# Patient Record
Sex: Female | Born: 1965 | Race: Black or African American | Hispanic: No | Smoking: Never smoker
Health system: Southern US, Community
[De-identification: ages and names within clinical notes are randomized; demographics above are authoritative.]

## PROBLEM LIST (undated history)

## (undated) DIAGNOSIS — Z91013 Allergy to seafood: Secondary | ICD-10-CM

## (undated) HISTORY — DX: Morbid (severe) obesity due to excess calories: E66.01

---

## 1999-02-13 ENCOUNTER — Encounter: Payer: Self-pay | Admitting: Emergency Medicine

## 1999-02-13 ENCOUNTER — Emergency Department (HOSPITAL_COMMUNITY): Admission: EM | Admit: 1999-02-13 | Discharge: 1999-02-13 | Payer: Self-pay | Admitting: Emergency Medicine

## 1999-07-29 ENCOUNTER — Emergency Department (HOSPITAL_COMMUNITY): Admission: EM | Admit: 1999-07-29 | Discharge: 1999-07-29 | Payer: Self-pay | Admitting: Emergency Medicine

## 1999-11-10 ENCOUNTER — Emergency Department (HOSPITAL_COMMUNITY): Admission: EM | Admit: 1999-11-10 | Discharge: 1999-11-10 | Payer: Self-pay | Admitting: *Deleted

## 2002-12-30 ENCOUNTER — Emergency Department (HOSPITAL_COMMUNITY): Admission: EM | Admit: 2002-12-30 | Discharge: 2002-12-30 | Payer: Self-pay | Admitting: Emergency Medicine

## 2011-11-19 ENCOUNTER — Encounter (HOSPITAL_COMMUNITY): Payer: Self-pay | Admitting: Emergency Medicine

## 2011-11-19 ENCOUNTER — Emergency Department (HOSPITAL_COMMUNITY)
Admission: EM | Admit: 2011-11-19 | Discharge: 2011-11-20 | Disposition: A | Discharge status: Home / Self Care | Payer: BC Managed Care – PPO | Attending: Emergency Medicine | Admitting: Emergency Medicine

## 2011-11-19 DIAGNOSIS — R300 Dysuria: Secondary | ICD-10-CM | POA: Insufficient documentation

## 2011-11-19 DIAGNOSIS — J45909 Unspecified asthma, uncomplicated: Secondary | ICD-10-CM | POA: Insufficient documentation

## 2011-11-19 DIAGNOSIS — S339XXA Sprain of unspecified parts of lumbar spine and pelvis, initial encounter: Secondary | ICD-10-CM | POA: Insufficient documentation

## 2011-11-19 DIAGNOSIS — S39012A Strain of muscle, fascia and tendon of lower back, initial encounter: Secondary | ICD-10-CM

## 2011-11-19 DIAGNOSIS — X58XXXA Exposure to other specified factors, initial encounter: Secondary | ICD-10-CM | POA: Insufficient documentation

## 2011-11-19 DIAGNOSIS — E669 Obesity, unspecified: Secondary | ICD-10-CM | POA: Insufficient documentation

## 2011-11-19 DIAGNOSIS — M545 Low back pain, unspecified: Secondary | ICD-10-CM | POA: Insufficient documentation

## 2011-11-19 DIAGNOSIS — R109 Unspecified abdominal pain: Secondary | ICD-10-CM | POA: Insufficient documentation

## 2011-11-19 LAB — URINALYSIS, ROUTINE W REFLEX MICROSCOPIC
Bilirubin Urine: NEGATIVE
Glucose, UA: NEGATIVE mg/dL
Hgb urine dipstick: NEGATIVE
Ketones, ur: 15 mg/dL — AB
Leukocytes, UA: NEGATIVE
Nitrite: NEGATIVE
Protein, ur: NEGATIVE mg/dL
Specific Gravity, Urine: 1.027 (ref 1.005–1.030)
Urobilinogen, UA: 1 mg/dL (ref 0.0–1.0)
pH: 7 (ref 5.0–8.0)

## 2011-11-19 NOTE — ED Provider Notes (Signed)
Medical screening examination/treatment/procedure(s) were performed by non-physician practitioner and as supervising physician I was immediately available for consultation/collaboration.  Jasmine Awe, MD 11/19/11 [phone]

## 2011-11-19 NOTE — ED Notes (Signed)
Pt c/o left flank pain onset yesterday.  Nausea without vomiting

## 2011-11-19 NOTE — ED Provider Notes (Cosign Needed Addendum)
History     CSN: [id number]  Arrival date & time 11/19/11  2109   First MD Initiated Contact with Patient 11/19/11 2313      Chief Complaint  Patient presents with  . Flank Pain    (Consider location/radiation/quality/duration/timing/severity/associated sxs/prior treatment) HPI Comments: Last of morbidly, obese patient was getting ready for bed.  She noticed some low left back pain and some dysuria.  This morning.  Denies fever, nausea, vomiting, chills  Patient is a 46 y.o. female presenting with flank pain. The history is provided by the patient.  Flank Pain This is a new problem. The current episode started yesterday. Associated symptoms include urinary symptoms. Pertinent negatives include no abdominal pain, chills, congestion, coughing, fever, nausea, sore throat, vomiting or weakness.    Past Medical History  Diagnosis Date  . Asthma     Past Surgical History  Procedure Date  . Cesarean section     No family history on file.  History  Substance Use Topics  . Smoking status: Never Smoker   . Smokeless tobacco: Not on file  . Alcohol Use: No    OB History    Grav Para Term Preterm Abortions TAB SAB Ect Mult Living                  Review of Systems  Constitutional: Negative for fever and chills.  HENT: Negative for congestion and sore throat.   Respiratory: Negative for cough.   Gastrointestinal: Negative for nausea, vomiting and abdominal pain.  Genitourinary: Positive for dysuria and flank pain.  Musculoskeletal: Positive for back pain.  Neurological: Negative for dizziness and weakness.    Allergies  Nsaids; Shellfish allergy; and Tylox  Home Medications   Current Outpatient Rx  Name Route Sig Dispense Refill  . CYCLOBENZAPRINE HCL 5 MG PO TABS Oral Take 1 tablet (5 mg total) by mouth 3 (three) times daily as needed for muscle spasms. 30 tablet 0  . TRAMADOL HCL 50 MG PO TABS Oral Take 1 tablet (50 mg total) by mouth every 6 (six) hours as needed  for pain. 15 tablet 0    BP 145/82  Pulse 67  Temp(Src) 98.3 F (36.8 C) (Oral)  Resp 17  SpO2 97%  LMP 10/19/2011  Physical Exam  Constitutional: She is oriented to person, place, and time. She appears well-developed and well-nourished.  HENT:  Head: Normocephalic.  Eyes: Pupils are equal, round, and reactive to light.  Neck: Normal range of motion.  Cardiovascular: Normal rate.   Pulmonary/Chest: Effort normal.  Musculoskeletal:       Arms: Neurological: She is alert and oriented to person, place, and time.  Skin: Skin is warm and dry.    ED Course  Procedures (including critical care time)  Labs Reviewed  URINALYSIS, ROUTINE W REFLEX MICROSCOPIC - Abnormal; Notable for the following:    APPearance CLOUDY (*)    Ketones, ur 15 (*)    All other components within normal limits   No results found.   1. Lumbosacral strain       MDM  This is most likely muscular in nature, but will check a urine, as patient has complaints of dysuria        Arman Filter, NP 11/19/11 2358  Arman Filter, NP 11/20/11 4098

## 2011-11-19 NOTE — ED Notes (Signed)
Pt reports (L) flank and back pain x 3 days.  Denies urinary symptoms.  Reports increased pain with movement and walking.  No distress noted.

## 2011-11-20 MED ORDER — TRAMADOL HCL 50 MG PO TABS: 50.0000 mg | ORAL_TABLET | Freq: Four times a day (QID) | ORAL | Status: AC | PRN

## 2011-11-20 MED ORDER — CYCLOBENZAPRINE HCL 5 MG PO TABS: 5.0000 mg | ORAL_TABLET | Freq: Three times a day (TID) | ORAL | Status: AC | PRN

## 2012-12-09 ENCOUNTER — Encounter (HOSPITAL_COMMUNITY): Payer: Self-pay | Admitting: *Deleted

## 2012-12-09 ENCOUNTER — Emergency Department (INDEPENDENT_AMBULATORY_CARE_PROVIDER_SITE_OTHER)
Admission: EM | Admit: 2012-12-09 | Discharge: 2012-12-09 | Disposition: A | Discharge status: Home / Self Care | Payer: BC Managed Care – PPO | Source: Home / Self Care | Attending: Emergency Medicine | Admitting: Emergency Medicine

## 2012-12-09 DIAGNOSIS — T7800XA Anaphylactic reaction due to unspecified food, initial encounter: Secondary | ICD-10-CM

## 2012-12-09 HISTORY — DX: Allergy to seafood: Z91.013

## 2012-12-09 MED ORDER — METHYLPREDNISOLONE SODIUM SUCC 125 MG IJ SOLR
INTRAMUSCULAR | Status: AC
  Filled 2012-12-09: qty 2

## 2012-12-09 MED ORDER — IPRATROPIUM BROMIDE 0.02 % IN SOLN
0.5000 mg | Freq: Once | RESPIRATORY_TRACT | Status: AC
  Administered 2012-12-09: 0.5 mg via RESPIRATORY_TRACT

## 2012-12-09 MED ORDER — SODIUM CHLORIDE 0.9 % IV SOLN
INTRAVENOUS | Status: DC
  Administered 2012-12-09 (×3): via INTRAVENOUS

## 2012-12-09 MED ORDER — DIPHENHYDRAMINE HCL 50 MG/ML IJ SOLN
INTRAMUSCULAR | Status: AC
  Filled 2012-12-09: qty 1

## 2012-12-09 MED ORDER — DIPHENHYDRAMINE HCL 50 MG/ML IJ SOLN
50.0000 mg | Freq: Once | INTRAMUSCULAR | Status: AC
  Administered 2012-12-09: 50 mg via INTRAMUSCULAR

## 2012-12-09 MED ORDER — ALBUTEROL SULFATE (5 MG/ML) 0.5% IN NEBU
5.0000 mg | INHALATION_SOLUTION | Freq: Once | RESPIRATORY_TRACT | Status: AC
  Administered 2012-12-09: 5 mg via RESPIRATORY_TRACT

## 2012-12-09 MED ORDER — EPINEPHRINE HCL 1 MG/ML IJ SOLN
0.3000 mg | Freq: Once | INTRAMUSCULAR | Status: AC
  Administered 2012-12-09: 0.3 mg via INTRAMUSCULAR

## 2012-12-09 MED ORDER — ALBUTEROL SULFATE (5 MG/ML) 0.5% IN NEBU
INHALATION_SOLUTION | RESPIRATORY_TRACT | Status: AC
  Filled 2012-12-09: qty 1

## 2012-12-09 MED ORDER — ALBUTEROL SULFATE HFA 108 (90 BASE) MCG/ACT IN AERS: 1.0000 | INHALATION_SPRAY | Freq: Four times a day (QID) | RESPIRATORY_TRACT | Status: DC | PRN

## 2012-12-09 MED ORDER — EPINEPHRINE HCL 1 MG/ML IJ SOLN
INTRAMUSCULAR | Status: AC
  Filled 2012-12-09: qty 1

## 2012-12-09 MED ORDER — METHYLPREDNISOLONE SODIUM SUCC 125 MG IJ SOLR
125.0000 mg | Freq: Once | INTRAMUSCULAR | Status: AC
  Administered 2012-12-09: 125 mg via INTRAVENOUS

## 2012-12-09 NOTE — ED Notes (Signed)
Ate her daughters egg roll @ 1610.  Allergic to shrimp and did not know it had shrimp in it.  C/o itching in her throat and lungs.  No rash or itching.  No swelling of tongue.  States it feels like her throat is swelling.  Voice sounds hoarse.  No acute respiratory distress on arrival.

## 2012-12-09 NOTE — ED Provider Notes (Signed)
Chief Complaint  Patient presents with  . 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 About 30 minutes prior to her arrival here she inadvertently ate an egg roll that contains some shrimp. Within 15 minutes of ingestion she noted an itchy feeling in her throat and in her chest, tightness in her lungs, and difficulty breathing. She did not have any generalized skin rash or swelling of her lips or tongue. She did have some wheezing.  Review of Systems:  Other than noted above, the patient denies any of the following symptoms. Systemic:  No fever, chills, sweats, or fatigue. ENT:  No nasal congestion, rhinorrhea, or sore throat. Pulmonary:  No cough, wheezing, shortness of breath, sputum production, hemoptysis. Cardiac:  No palpitations, rapid heartbeat, dizziness, presyncope or syncope. Skin:  No rash or itching. Ext:  No leg pain or swelling. Psych:  No anxiety or depression.  PMFSH:  Past medical history, family history, social history, meds, and allergies were reviewed and updated as needed. The patient denies any history of asthma, lung disease, heart disease, anxiety, or tobacco use.  Physical Exam:   Vital signs:  BP 143/71  Pulse 110  Temp(Src) 99 F (37.2 C) (Oral)  Resp 23  SpO2 99%  LMP 11/19/2012 Gen:  Alert, oriented, in mild distress, skin warm and dry. She appeared somewhat nervous and anxious and felt better sitting up than lying down. Eye:  PERRL, lids and conjunctivas normal.  Sclera non-icteric. ENT:  Mucous membranes moist, pharynx clear. Neck:  Supple, no adenopathy or tenderness.  No JVD. Lungs:  She had some expiratory wheezes anteriorly with good air movement bilaterally no rales. Heart:  Regular rhythm.  No gallops, murmers, clicks or rubs. Abdomen:  Soft, nontender, no organomegaly or mass.  Bowel sounds  normal.  No pulsatile abdominal mass or bruit. Ext:  No edema.  No calf tenderness and Homann's sign negative.  Pulses full and equal. Skin:  Warm and dry.  No rash.  Course in Urgent Care Center:   Immediately upon arrival she was started on nasal O2 at 2 L per minute and IV normal saline was started at 150 mL per hour. She was given Benadryl 50 mg IM and epinephrine 0.3 mL IM followed by Solu-Medrol 125 mg IV. She was reassessed thereafter and felt somewhat better but still had some wheezes and she states her lungs still felt a little bit tight. She was given a DuoNeb breathing treatment after that other than feeling shaky and tremulous she states she felt fine. She had no difficulty breathing. Lungs are completely clear at that time and her skin was clear throughout.  Assessment:  The encounter diagnosis was Anaphylactic reaction due to food, initial encounter.   Plan:   1.  The following meds were prescribed:   New Prescriptions   ALBUTEROL (PROVENTIL HFA;VENTOLIN HFA) 108 (90 BASE) MCG/ACT INHALER    Inhale 1-2 puffs into the lungs every 6 (six) hours as needed for wheezing.   She is to take Benadryl 25 mg every 4 hours while awake.  2.  The patient was instructed in symptomatic care and handouts were given. 3.  The patient was told to return if becoming worse in any way, if no better in 3 or 4 days, and given some red flag symptoms that would indicate earlier return.  Follow up:  The patient was told to follow up if she should have any further difficulty, suggested she go directly to the emergency room.     Reuben Likes, MD 12/09/12 [phone]

## 2018-06-22 ENCOUNTER — Emergency Department (HOSPITAL_COMMUNITY)
Admission: EM | Admit: 2018-06-22 | Discharge: 2018-06-22 | Disposition: A | Discharge status: Home / Self Care | Payer: Self-pay | Attending: Emergency Medicine | Admitting: Emergency Medicine

## 2018-06-22 ENCOUNTER — Emergency Department (HOSPITAL_COMMUNITY): Payer: Self-pay

## 2018-06-22 ENCOUNTER — Encounter (HOSPITAL_COMMUNITY): Payer: Self-pay

## 2018-06-22 ENCOUNTER — Other Ambulatory Visit: Payer: Self-pay

## 2018-06-22 DIAGNOSIS — J45909 Unspecified asthma, uncomplicated: Secondary | ICD-10-CM | POA: Insufficient documentation

## 2018-06-22 DIAGNOSIS — Z79899 Other long term (current) drug therapy: Secondary | ICD-10-CM | POA: Insufficient documentation

## 2018-06-22 DIAGNOSIS — M79671 Pain in right foot: Secondary | ICD-10-CM | POA: Insufficient documentation

## 2018-06-22 LAB — CBG MONITORING, ED: Glucose-Capillary: 110 mg/dL — ABNORMAL HIGH (ref 70–99)

## 2018-06-22 NOTE — ED Notes (Signed)
Pt requesting to have blood sugar checked.

## 2018-06-22 NOTE — Discharge Instructions (Signed)
You may alternate taking Tylenol and Ibuprofen as needed for pain control. You may take 400-600 mg of ibuprofen every 6 hours and [phone] mg of Tylenol every 6 hours. Do not exceed 4000 mg of Tylenol daily as this can lead to liver damage. Also, make sure to take Ibuprofen with meals as it can cause an upset stomach. Do not take other NSAIDs while taking Ibuprofen such as (Aleve, Naprosyn, Aspirin, Celebrex, etc) and do not take more than the prescribed dose as this can lead to ulcers and bleeding in your GI tract. You may use warm and cold compresses to help with your symptoms.   Please follow up with your primary doctor within the next 7-10 days for re-evaluation and further treatment of your symptoms.  You may also follow-up with the orthopedic doctor that was provided for you in your discharge paperwork.  You may repeat imaging in 7 to 10 days to rule out an occult fracture.  Please return to the ER sooner if you have any new or worsening symptoms.

## 2018-06-22 NOTE — ED Triage Notes (Signed)
Pt states while at work yesterday she noticed her right foot was hurting and swelling. Swelling noted to RLE with strong pulses, +1 edema.

## 2018-06-22 NOTE — ED Notes (Signed)
Pt verbalizes understanding of d/c instructions. Pt taken to lobby in wheelchair at d/c with all belongings and with family.   

## 2018-06-22 NOTE — ED Notes (Addendum)
ED Provider at bedside. 

## 2018-06-22 NOTE — ED Provider Notes (Signed)
[facility] EMERGENCY DEPARTMENT Provider Note   CSN: [id number] Arrival date & time: 06/22/18  1650     History   Chief Complaint Chief Complaint  Patient presents with  . Leg Swelling    HPI Debbie Vaughn is a 52 y.o. female.  HPI   Patient is a 52 year old female who presents emergency department today for evaluation of right foot pain that began yesterday while she was at work.  States that she is greater and she stands on her feet for multiple hours per day.  States she does not have good support in her shoes.  The pain is constant and worse with ambulation.  Has not tried any medications for her symptoms.  Pain is severe.  Denies any falls or known trauma but thinks she may have twisted in her sleep.  No fevers or chills.  States that her bilateral feet are chronically swollen, she feels that the swelling is worse on her right foot.  Past Medical History:  Diagnosis Date  . Asthma   . Shrimp allergy     There are no active problems to display for this patient.   Past Surgical History:  Procedure Laterality Date  . CESAREAN SECTION       OB History   None      Home Medications    Prior to Admission medications   Medication Sig Start Date End Date Taking? Authorizing Provider  albuterol (PROVENTIL HFA;VENTOLIN HFA) 108 (90 BASE) MCG/ACT inhaler Inhale 1-2 puffs into the lungs every 6 (six) hours as needed for wheezing. 12/09/12   Reuben Likes, MD    Family History Family History  Problem Relation Age of Onset  . Asthma Mother   . Diabetes Father     Social History Social History   Tobacco Use  . Smoking status: Never Smoker  . Smokeless tobacco: Never Used  Substance Use Topics  . Alcohol use: No  . Drug use: No     Allergies   Nsaids; Shellfish allergy; and Oxycodone-acetaminophen   Review of Systems Review of Systems  Constitutional: Negative for chills and fever.  Musculoskeletal:       Right foot pain  Skin:  Negative for color change and wound.     Physical Exam Updated Vital Signs BP (!) 137/98 (BP Location: Right Arm)   Pulse 91   Temp 98.8 F (37.1 C) (Oral)   Resp 18   Ht 5\' 5"  (1.651 m)   Wt 122.5 kg   SpO2 100%   BMI 44.93 kg/m   Physical Exam  Constitutional: She appears well-developed and well-nourished. No distress.  HENT:  Head: Normocephalic and atraumatic.  Eyes: Conjunctivae are normal.  Neck: Neck supple.  Cardiovascular: Normal rate.  Pulmonary/Chest: Effort normal.  Musculoskeletal:  Patient with tenderness along the bones with no obvious deformity.  Bilateral feet appear to be swollen but not edematous.  No asymmetric lower extremity swelling.  No calf tenderness.  No erythema noted.  Neurological: She is alert.  Skin: Skin is warm and dry.  Psychiatric: She has a normal mood and affect.  Nursing note and vitals reviewed.    ED Treatments / Results  Labs (all labs ordered are listed, but only abnormal results are displayed) Labs Reviewed  CBG MONITORING, ED - Abnormal; Notable for the following components:      Result Value   Glucose-Capillary 110 (*)    All other components within normal limits    EKG None  Radiology Dg  Foot Complete Right  Result Date: 06/22/2018 CLINICAL DATA:  Right foot pain and swelling EXAM: RIGHT FOOT COMPLETE - 3+ VIEW COMPARISON:  None. FINDINGS: No acute fracture. No dislocation. Soft tissue swelling over the dorsum of the forefoot. IMPRESSION: No acute bony pathology.  Soft tissue swelling is noted distally. Electronically Signed   By: Jolaine ClickArthur  Hoss M.D.   On: 06/22/2018 19:22    Procedures Procedures (including critical care time)  Medications Ordered in ED Medications - No data to display   Initial Impression / Assessment and Plan / ED Course  I have reviewed the triage vital signs and the nursing notes.  Pertinent labs & imaging results that were available during my care of the patient were reviewed by me and  considered in my medical decision making (see chart for details).     Final Clinical Impressions(s) / ED Diagnoses   Final diagnoses:  Right foot pain   Patient presenting with right foot pain and subjective swelling since yesterday.  No injury reported.  Patient has tenderness over the navicular bone on the medial aspect of the right foot.  No obvious unilateral swelling on exam.  No edema noted.  No erythema or warmth noted to suggest skin or soft tissue infection.  Discussed with patient that she may have a small stress fracture she may need to have repeat imaging in 7 to 10 days for reevaluation.  Will provide postop shoe and Ortho referral for follow-up.  Advised conservative with Tylenol, Motrin and rice protocol at home.  Return precautions discussed.  Patient voices understanding of plan reasons to return.  All questions answered  ED Discharge Orders    None       Rayne DuCouture, Dwayn Moravek S, PA-C 06/22/18 2017    Terrilee FilesButler, Michael C, MD 06/23/18 1104

## 2018-06-22 NOTE — ED Notes (Signed)
See EDP assessment 

## 2018-06-27 ENCOUNTER — Encounter (HOSPITAL_COMMUNITY): Payer: Self-pay | Admitting: Emergency Medicine

## 2018-06-27 ENCOUNTER — Emergency Department (HOSPITAL_COMMUNITY): Payer: Self-pay

## 2018-06-27 ENCOUNTER — Emergency Department (HOSPITAL_COMMUNITY)
Admission: EM | Admit: 2018-06-27 | Discharge: 2018-06-28 | Disposition: A | Discharge status: Home / Self Care | Payer: Self-pay | Attending: Emergency Medicine | Admitting: Emergency Medicine

## 2018-06-27 ENCOUNTER — Other Ambulatory Visit: Payer: Self-pay

## 2018-06-27 DIAGNOSIS — J4541 Moderate persistent asthma with (acute) exacerbation: Secondary | ICD-10-CM | POA: Insufficient documentation

## 2018-06-27 LAB — BASIC METABOLIC PANEL
Anion gap: 9 (ref 5–15)
BUN: 11 mg/dL (ref 6–20)
CALCIUM: 8.8 mg/dL — AB (ref 8.9–10.3)
CO2: 26 mmol/L (ref 22–32)
CREATININE: 0.74 mg/dL (ref 0.44–1.00)
Chloride: 105 mmol/L (ref 98–111)
GFR calc Af Amer: >60 mL/min (ref >60)
GFR calc non Af Amer: >60 mL/min (ref >60)
GLUCOSE: 110 mg/dL — AB (ref 70–99)
Potassium: 3.4 mmol/L — ABNORMAL LOW (ref 3.5–5.1)
Sodium: 140 mmol/L (ref 135–145)

## 2018-06-27 LAB — CBC
HCT: 39.8 % (ref 36.0–46.0)
Hemoglobin: 12.1 g/dL (ref 12.0–15.0)
MCH: 28.3 pg (ref 26.0–34.0)
MCHC: 30.4 g/dL (ref 30.0–36.0)
MCV: 93 fL (ref 78.0–100.0)
PLATELETS: 269 10*3/uL (ref 150–400)
RBC: 4.28 MIL/uL (ref 3.87–5.11)
RDW: 14.2 % (ref 11.5–15.5)
WBC: 8.4 10*3/uL (ref 4.0–10.5)

## 2018-06-27 LAB — I-STAT TROPONIN, ED: TROPONIN I, POC: 0 ng/mL (ref 0.00–0.08)

## 2018-06-27 LAB — I-STAT BETA HCG BLOOD, ED (MC, WL, AP ONLY): I-stat hCG, quantitative: <5 m[IU]/mL (ref <5)

## 2018-06-27 MED ORDER — ALBUTEROL SULFATE (2.5 MG/3ML) 0.083% IN NEBU
5.0000 mg | INHALATION_SOLUTION | Freq: Once | RESPIRATORY_TRACT | Status: AC
  Administered 2018-06-27: 5 mg via RESPIRATORY_TRACT
  Filled 2018-06-27: qty 6

## 2018-06-27 NOTE — ED Triage Notes (Signed)
Pt c/o shortness of breath and intermittent chest pain. Reports she recently moved in with her dad who smokes and "sprays a lot of chemicals".

## 2018-06-28 MED ORDER — ALBUTEROL SULFATE HFA 108 (90 BASE) MCG/ACT IN AERS: 1.0000 | INHALATION_SPRAY | Freq: Four times a day (QID) | RESPIRATORY_TRACT | 0 refills | Status: DC | PRN

## 2018-06-28 MED ORDER — PREDNISONE 20 MG PO TABS
60.0000 mg | ORAL_TABLET | Freq: Once | ORAL | Status: AC
  Administered 2018-06-28: 60 mg via ORAL
  Filled 2018-06-28: qty 3

## 2018-06-28 MED ORDER — ALBUTEROL SULFATE (2.5 MG/3ML) 0.083% IN NEBU
5.0000 mg | INHALATION_SOLUTION | Freq: Once | RESPIRATORY_TRACT | Status: AC
  Administered 2018-06-28: 5 mg via RESPIRATORY_TRACT
  Filled 2018-06-28: qty 6

## 2018-06-28 MED ORDER — IPRATROPIUM BROMIDE 0.02 % IN SOLN
0.5000 mg | Freq: Once | RESPIRATORY_TRACT | Status: AC
  Administered 2018-06-28: 0.5 mg via RESPIRATORY_TRACT
  Filled 2018-06-28: qty 2.5

## 2018-06-28 MED ORDER — PREDNISONE 50 MG PO TABS: ORAL_TABLET | ORAL | 0 refills | Status: DC

## 2018-06-28 NOTE — ED Provider Notes (Signed)
[facility] EMERGENCY DEPARTMENT Provider Note   CSN: [id number] Arrival date & time: 06/27/18  2227     History   Chief Complaint Chief Complaint  Patient presents with  . Shortness of Breath    HPI Debbie Vaughn is a 52 y.o. female.  The history is provided by the patient.  Shortness of Breath  This is a new problem. The current episode started more than 1 week ago. The problem has been gradually worsening. Associated symptoms include wheezing. Pertinent negatives include no fever, no hemoptysis and no vomiting. The problem's precipitants include smoke. Associated medical issues include asthma.   With history of asthma presents with shortness of breath over the past 2 weeks.  She reports recently moved in with her father, since that time she been exposed to secondhand smoke as well as "lots of chemicals ".  She reports that there is frequent use of insecticides.  She is a non-smoker. She reports mild chest tightness due to wheezing.  Denies hemoptysis.  She reports her asthma is typically very well controlled Past Medical History:  Diagnosis Date  . Asthma   . Shrimp allergy     There are no active problems to display for this patient.   Past Surgical History:  Procedure Laterality Date  . CESAREAN SECTION       OB History   None      Home Medications    Prior to Admission medications   Medication Sig Start Date End Date Taking? Authorizing Provider  albuterol (PROVENTIL HFA;VENTOLIN HFA) 108 (90 BASE) MCG/ACT inhaler Inhale 1-2 puffs into the lungs every 6 (six) hours as needed for wheezing. 12/09/12   Reuben LikesKeller, David C, MD    Family History Family History  Problem Relation Age of Onset  . Asthma Mother   . Diabetes Father     Social History Social History   Tobacco Use  . Smoking status: Never Smoker  . Smokeless tobacco: Never Used  Substance Use Topics  . Alcohol use: No  . Drug use: No     Allergies   Nsaids; Shellfish  allergy; and Oxycodone-acetaminophen   Review of Systems Review of Systems  Constitutional: Negative for fever.  Respiratory: Positive for shortness of breath and wheezing. Negative for hemoptysis.   Gastrointestinal: Negative for vomiting.  All other systems reviewed and are negative.    Physical Exam Updated Vital Signs BP (!) 158/91   Pulse 79   Temp 98.6 F (37 C) (Oral)   Resp 17   SpO2 100%   Physical Exam CONSTITUTIONAL: Well developed/well nourished HEAD: Normocephalic/atraumatic EYES: EOMI/PERRL ENMT: Mucous membranes moist NECK: supple no meningeal signs, no JVD SPINE/BACK:entire spine nontender CV: S1/S2 noted, no murmurs/rubs/gallops noted LUNGS: decreased BS noted bilaterally, mild tachypnea ABDOMEN: soft, nontender, no rebound or guarding, bowel sounds noted throughout abdomen GU:no cva tenderness NEURO: Pt is awake/alert/appropriate, moves all extremitiesx4.  No facial droop.   EXTREMITIES: pulses normal/equal, full ROM, no significant lower extremity edema SKIN: warm, color normal PSYCH: no abnormalities of mood noted, alert and oriented to situation   ED Treatments / Results  Labs (all labs ordered are listed, but only abnormal results are displayed) Labs Reviewed  BASIC METABOLIC PANEL - Abnormal; Notable for the following components:      Result Value   Potassium 3.4 (*)    Glucose, Bld 110 (*)    Calcium 8.8 (*)    All other components within normal limits  CBC  I-STAT TROPONIN, ED  I-STAT BETA HCG BLOOD, ED ([facility], [facility], [facility] ONLY)    EKG EKG Interpretation  Date/Time:  Tuesday June 27 2018 22:34:46 EDT Ventricular Rate:  94 PR Interval:  152 QRS Duration: 82 QT Interval:  368 QTC Calculation: 460 R Axis:   52 Text Interpretation:  Normal sinus rhythm Septal infarct , age undetermined Abnormal ECG Confirmed by Zadie Rhine ([id number]) on 06/28/2018 3:00:22 AM   Radiology Dg Chest 2 View  Result Date: 06/27/2018 CLINICAL DATA:   Chest pain EXAM: CHEST - 2 VIEW COMPARISON:  None. FINDINGS: The heart size and mediastinal contours are within normal limits. Both lungs are clear. Mild degenerative changes of the spine. IMPRESSION: No active cardiopulmonary disease. Electronically Signed   By: Jasmine Pang M.D.   On: 06/27/2018 23:39    Procedures Procedures   Medications Ordered in ED Medications  albuterol (PROVENTIL) (2.5 MG/3ML) 0.083% nebulizer solution 5 mg (5 mg Nebulization Given 06/27/18 2244)  albuterol (PROVENTIL) (2.5 MG/3ML) 0.083% nebulizer solution 5 mg (5 mg Nebulization Given 06/28/18 0322)  ipratropium (ATROVENT) nebulizer solution 0.5 mg (0.5 mg Nebulization Given 06/28/18 0323)  predniSONE (DELTASONE) tablet 60 mg (60 mg Oral Given 06/28/18 0323)     Initial Impression / Assessment and Plan / ED Course  I have reviewed the triage vital signs and the nursing notes.  Pertinent labs & imaging results that were available during my care of the patient were reviewed by me and considered in my medical decision making (see chart for details).     Patient presents with asthma exacerbation likely due to secondhand smoke as well as her mental exposures.  She is improved here in the emergency department with albuterol/Atrovent/prednisone.  She ambulated in the ER without difficulty. She is requesting discharge home so she can go to work.  Will give prescription for albuterol prednisone.  We discussed strict return precautions  Final Clinical Impressions(s) / ED Diagnoses   Final diagnoses:  Moderate persistent asthma with exacerbation    ED Discharge Orders         Ordered    albuterol (PROVENTIL HFA;VENTOLIN HFA) 108 (90 Base) MCG/ACT inhaler  Every 6 hours PRN     06/28/18 0455    predniSONE (DELTASONE) 50 MG tablet     06/28/18 0455           Zadie Rhine, MD 06/28/18 [phone]

## 2018-06-28 NOTE — ED Notes (Signed)
Pt ambulated with ease. O2 saturation stayed between 94%-97%

## 2018-08-27 ENCOUNTER — Ambulatory Visit (HOSPITAL_COMMUNITY)
Admission: EM | Admit: 2018-08-27 | Discharge: 2018-08-27 | Disposition: A | Discharge status: Home / Self Care | Payer: BLUE CROSS/BLUE SHIELD | Attending: Family Medicine | Admitting: Family Medicine

## 2018-08-27 ENCOUNTER — Encounter (HOSPITAL_COMMUNITY): Payer: Self-pay | Admitting: Emergency Medicine

## 2018-08-27 ENCOUNTER — Telehealth (HOSPITAL_COMMUNITY): Payer: Self-pay | Admitting: Emergency Medicine

## 2018-08-27 ENCOUNTER — Other Ambulatory Visit: Payer: Self-pay

## 2018-08-27 DIAGNOSIS — J4521 Mild intermittent asthma with (acute) exacerbation: Secondary | ICD-10-CM

## 2018-08-27 DIAGNOSIS — R062 Wheezing: Secondary | ICD-10-CM

## 2018-08-27 MED ORDER — ALBUTEROL SULFATE HFA 108 (90 BASE) MCG/ACT IN AERS
INHALATION_SPRAY | RESPIRATORY_TRACT | Status: AC
  Filled 2018-08-27: qty 6.7

## 2018-08-27 MED ORDER — ALBUTEROL SULFATE HFA 108 (90 BASE) MCG/ACT IN AERS
2.0000 | INHALATION_SPRAY | Freq: Once | RESPIRATORY_TRACT | Status: AC
  Administered 2018-08-27: 2 via RESPIRATORY_TRACT

## 2018-08-27 MED ORDER — CETIRIZINE HCL 10 MG PO TABS: 10.0000 mg | ORAL_TABLET | Freq: Every day | ORAL | 0 refills | Status: DC

## 2018-08-27 MED ORDER — IPRATROPIUM-ALBUTEROL 0.5-2.5 (3) MG/3ML IN SOLN
3.0000 mL | Freq: Once | RESPIRATORY_TRACT | Status: AC
Start: 2018-08-27 — End: 2018-08-27
  Administered 2018-08-27: 3 mL via RESPIRATORY_TRACT

## 2018-08-27 MED ORDER — PREDNISONE 20 MG PO TABS: ORAL_TABLET | ORAL | 0 refills | Status: DC

## 2018-08-27 MED ORDER — IPRATROPIUM-ALBUTEROL 0.5-2.5 (3) MG/3ML IN SOLN
RESPIRATORY_TRACT | Status: AC
  Filled 2018-08-27: qty 3

## 2018-08-27 NOTE — Telephone Encounter (Signed)
Patient requested that her medications be resent to the 24 hour [facility].

## 2018-08-27 NOTE — Discharge Instructions (Signed)
Course of prednisone to help with asthma flare.  May start zyrtec daily as well.  Use of inhaler provided every 4-6 hours as needed for wheezing or shortness of breath .  Please establish with a primary care provider for management of your asthma symptoms.

## 2018-08-27 NOTE — ED Provider Notes (Signed)
[facility]    CSN: [id number] Arrival date & time: 08/27/18  1548     History   Chief Complaint Chief Complaint  Patient presents with  . Asthma    HPI Debbie Vaughn is a 52 y.o. female.   Debbie Vaughn presents with complaints of asthma flare. She has had intermittent issues with her asthma since moving in with her father in June of this year. States that family members smoke in the home, and there are some bugs in the home which her father even sprays with treatment sprays. She sleeps on an air mattress in the living room. She lost her inhaler last night and hasn't been able to use today. It does help. She has wheezing, coughing, shortness of breath  At times. Cough is only sometimes productive. No chest pain or mucus production. She does not have a PCP. No fevers. Doesn't take any other medications for symptoms. Was seen in the ER 06/2018 for similar complaint.     ROS per HPI.      Past Medical History:  Diagnosis Date  . Asthma   . Shrimp allergy     There are no active problems to display for this patient.   Past Surgical History:  Procedure Laterality Date  . CESAREAN SECTION      OB History   None      Home Medications    Prior to Admission medications   Medication Sig Start Date End Date Taking? Authorizing Provider  albuterol (PROVENTIL HFA;VENTOLIN HFA) 108 (90 Base) MCG/ACT inhaler Inhale 1-2 puffs into the lungs every 6 (six) hours as needed for wheezing or shortness of breath. 06/28/18  Yes Zadie Rhine, MD  cetirizine (ZYRTEC) 10 MG tablet Take 1 tablet (10 mg total) by mouth daily. 08/27/18   Georgetta Haber, NP  predniSONE (DELTASONE) 20 MG tablet 3-3-3-2-2-2-1-1-1 08/27/18   Georgetta Haber, NP    Family History Family History  Problem Relation Age of Onset  . Asthma Mother   . Diabetes Father     Social History Social History   Tobacco Use  . Smoking status: Never Smoker  . Smokeless tobacco: Never Used  Substance Use  Topics  . Alcohol use: No  . Drug use: No     Allergies   Nsaids; Shellfish allergy; and Oxycodone-acetaminophen   Review of Systems Review of Systems   Physical Exam Triage Vital Signs ED Triage Vitals  Enc Vitals Group     BP 08/27/18 1629 (!) 150/99     Pulse Rate 08/27/18 1629 87     Resp --      Temp 08/27/18 1629 97.9 F (36.6 C)     Temp src --      SpO2 08/27/18 1629 98 %     Weight --      Height --      Head Circumference --      Peak Flow --      Pain Score 08/27/18 1630 5     Pain Loc --      Pain Edu? --      Excl. in GC? --    No data found.  Updated Vital Signs BP (!) 150/99 (BP Location: Left Arm)   Pulse 87   Temp 97.9 F (36.6 C)   SpO2 98%   Visual Acuity Right Eye Distance:   Left Eye Distance:   Bilateral Distance:    Right Eye Near:   Left Eye Near:    Bilateral Near:  Physical Exam  Constitutional: She is oriented to person, place, and time. She appears well-developed and well-nourished. No distress.  HENT:  Head: Normocephalic and atraumatic.  Right Ear: Tympanic membrane, external ear and ear canal normal.  Left Ear: Tympanic membrane, external ear and ear canal normal.  Nose: Nose normal.  Mouth/Throat: Uvula is midline, oropharynx is clear and moist and mucous membranes are normal. No tonsillar exudate.  Eyes: Pupils are equal, round, and reactive to light. Conjunctivae and EOM are normal.  Cardiovascular: Normal rate, regular rhythm and normal heart sounds.  Pulmonary/Chest: Effort normal. She has wheezes.  Expiratory wheezing throughout; no increased work of breathing or cough noted   Neurological: She is alert and oriented to person, place, and time.  Skin: Skin is warm and dry.     UC Treatments / Results  Labs (all labs ordered are listed, but only abnormal results are displayed) Labs Reviewed - No data to display  EKG None  Radiology No results found.  Procedures Procedures (including critical care  time)  Medications Ordered in UC Medications  albuterol (PROVENTIL HFA;VENTOLIN HFA) 108 (90 Base) MCG/ACT inhaler 2 puff (has no administration in time range)  ipratropium-albuterol (DUONEB) 0.5-2.5 (3) MG/3ML nebulizer solution 3 mL (has no administration in time range)    Initial Impression / Assessment and Plan / UC Course  I have reviewed the triage vital signs and the nursing notes.  Pertinent labs & imaging results that were available during my care of the patient were reviewed by me and considered in my medical decision making (see chart for details).     Wheezing today but without increased work of breathing or distress. duoneb provided. Inhaler provided prior to departure. Course of prednisone provided.zyrtec for potential allergens in home as well. Encouraged establish with PCP for management. Return precautions provided. Patient verbalized understanding and agreeable to plan.  Ambulatory out of clinic without difficulty.   Final Clinical Impressions(s) / UC Diagnoses   Final diagnoses:  Mild intermittent asthma with exacerbation     Discharge Instructions     Course of prednisone to help with asthma flare.  May start zyrtec daily as well.  Use of inhaler provided every 4-6 hours as needed for wheezing or shortness of breath .  Please establish with a primary care provider for management of your asthma symptoms.    ED Prescriptions    Medication Sig Dispense Auth. Provider   predniSONE (DELTASONE) 20 MG tablet 3-3-3-2-2-2-1-1-1 18 tablet Linus MakoBurky, Loreal Schuessler B, NP   cetirizine (ZYRTEC) 10 MG tablet Take 1 tablet (10 mg total) by mouth daily. 30 tablet Georgetta HaberBurky, Nylene Inlow B, NP     Controlled Substance Prescriptions [place] Controlled Substance Registry consulted? Not Applicable   Georgetta HaberBurky, Richanda Darin B, NP 08/27/18 1708

## 2018-08-27 NOTE — ED Triage Notes (Addendum)
Pt reports a history of asthma and her inhaler ran out yesterday.  She moved in with her father in June and he is a smoker.  She states she has been having trouble breathing since she moved in with him.  She is requesting a prescription for a nebulizer and a humidifier.

## 2018-11-13 ENCOUNTER — Emergency Department (HOSPITAL_COMMUNITY)
Admission: EM | Admit: 2018-11-13 | Discharge: 2018-11-14 | Disposition: A | Discharge status: Home / Self Care | Payer: BLUE CROSS/BLUE SHIELD | Attending: Emergency Medicine | Admitting: Emergency Medicine

## 2018-11-13 ENCOUNTER — Other Ambulatory Visit: Payer: Self-pay

## 2018-11-13 ENCOUNTER — Encounter (HOSPITAL_COMMUNITY): Payer: Self-pay

## 2018-11-13 DIAGNOSIS — R0602 Shortness of breath: Secondary | ICD-10-CM | POA: Diagnosis present

## 2018-11-13 DIAGNOSIS — J4541 Moderate persistent asthma with (acute) exacerbation: Secondary | ICD-10-CM | POA: Diagnosis not present

## 2018-11-13 MED ORDER — SODIUM CHLORIDE 0.9% FLUSH: 3.0000 mL | Freq: Once | INTRAVENOUS | Status: DC

## 2018-11-13 MED ORDER — ALBUTEROL SULFATE (2.5 MG/3ML) 0.083% IN NEBU
5.0000 mg | INHALATION_SOLUTION | Freq: Once | RESPIRATORY_TRACT | Status: AC
  Administered 2018-11-13: 5 mg via RESPIRATORY_TRACT

## 2018-11-13 MED ORDER — ALBUTEROL SULFATE (2.5 MG/3ML) 0.083% IN NEBU
2.5000 mg | INHALATION_SOLUTION | Freq: Once | RESPIRATORY_TRACT | Status: DC
  Filled 2018-11-13: qty 3

## 2018-11-13 NOTE — ED Triage Notes (Signed)
Pt here for shortness of breath and chest pain for the last 4 hours.  Hx of asthma and usees an albuterol inhaler.  Pt used last of inhaler this afternoon.  Wheezes noted bilaterally.  Chest pain occurs when taking a deep breath. A&Ox4.

## 2018-11-14 ENCOUNTER — Emergency Department (HOSPITAL_COMMUNITY): Payer: BLUE CROSS/BLUE SHIELD

## 2018-11-14 LAB — BASIC METABOLIC PANEL
Anion gap: 11 (ref 5–15)
BUN: 11 mg/dL (ref 6–20)
CO2: 27 mmol/L (ref 22–32)
CREATININE: 0.83 mg/dL (ref 0.44–1.00)
Calcium: 9 mg/dL (ref 8.9–10.3)
Chloride: 104 mmol/L (ref 98–111)
GFR calc Af Amer: >60 mL/min (ref >60)
GFR calc non Af Amer: >60 mL/min (ref >60)
Glucose, Bld: 108 mg/dL — ABNORMAL HIGH (ref 70–99)
Potassium: 4.2 mmol/L (ref 3.5–5.1)
SODIUM: 142 mmol/L (ref 135–145)

## 2018-11-14 LAB — CBC
HCT: 40.6 % (ref 36.0–46.0)
Hemoglobin: 12.3 g/dL (ref 12.0–15.0)
MCH: 27.9 pg (ref 26.0–34.0)
MCHC: 30.3 g/dL (ref 30.0–36.0)
MCV: 92.1 fL (ref 80.0–100.0)
Platelets: 270 10*3/uL (ref 150–400)
RBC: 4.41 MIL/uL (ref 3.87–5.11)
RDW: 14.2 % (ref 11.5–15.5)
WBC: 10.5 10*3/uL (ref 4.0–10.5)
nRBC: 0 % (ref 0.0–0.2)

## 2018-11-14 LAB — I-STAT TROPONIN, ED: Troponin i, poc: 0 ng/mL (ref 0.00–0.08)

## 2018-11-14 LAB — I-STAT BETA HCG BLOOD, ED (MC, WL, AP ONLY): I-stat hCG, quantitative: <5 m[IU]/mL (ref <5)

## 2018-11-14 MED ORDER — PREDNISONE 20 MG PO TABS
60.0000 mg | ORAL_TABLET | Freq: Once | ORAL | Status: AC
  Administered 2018-11-14: 60 mg via ORAL
  Filled 2018-11-14: qty 3

## 2018-11-14 MED ORDER — PREDNISONE 20 MG PO TABS: ORAL_TABLET | ORAL | 0 refills | Status: DC

## 2018-11-14 MED ORDER — ALBUTEROL SULFATE (2.5 MG/3ML) 0.083% IN NEBU
5.0000 mg | INHALATION_SOLUTION | Freq: Once | RESPIRATORY_TRACT | Status: AC
  Administered 2018-11-14: 5 mg via RESPIRATORY_TRACT
  Filled 2018-11-14: qty 6

## 2018-11-14 MED ORDER — ALBUTEROL SULFATE (2.5 MG/3ML) 0.083% IN NEBU: 2.5000 mg | INHALATION_SOLUTION | RESPIRATORY_TRACT | 1 refills | Status: DC | PRN

## 2018-11-14 MED ORDER — IPRATROPIUM BROMIDE 0.02 % IN SOLN
0.5000 mg | Freq: Once | RESPIRATORY_TRACT | Status: AC
  Administered 2018-11-14: 0.5 mg via RESPIRATORY_TRACT
  Filled 2018-11-14: qty 2.5

## 2018-11-14 NOTE — ED Notes (Signed)
Discharge instructions (including medications) discussed with and copy provided to patient/caregiver.  Pt stable, ambulatory, and verbalizes understanding of discharge instructions.

## 2018-11-14 NOTE — ED Provider Notes (Signed)
[facility] EMERGENCY DEPARTMENT Provider Note   CSN: [id number] Arrival date & time: 11/13/18  2338     History   Chief Complaint Chief Complaint  Patient presents with  . Shortness of Breath    HPI Debbie Vaughn is a 53 y.o. female.  HPI  53 year old female presents with shortness of breath and feeling like she has asthma attack.  She is been feeling short of breath for several days but it seems to be worse recently with new cough.  No fevers.  Her chest feels tight and feels like there is something stuck in her chest like congestion.  She has tried over-the-counter breathing treatment meds do a nebulizer without relief.  Was given albuterol in triage and states she feels a little bit better but still feels like there is something in her chest.  No leg swelling.  Past Medical History:  Diagnosis Date  . Asthma   . Shrimp allergy     There are no active problems to display for this patient.   Past Surgical History:  Procedure Laterality Date  . CESAREAN SECTION       OB History   No obstetric history on file.      Home Medications    Prior to Admission medications   Medication Sig Start Date End Date Taking? Authorizing Provider  albuterol (PROVENTIL HFA;VENTOLIN HFA) 108 (90 Base) MCG/ACT inhaler Inhale 1-2 puffs into the lungs every 6 (six) hours as needed for wheezing or shortness of breath. 06/28/18   Zadie Rhine, MD  albuterol (PROVENTIL) (2.5 MG/3ML) 0.083% nebulizer solution Take 3 mLs (2.5 mg total) by nebulization every 4 (four) hours as needed for wheezing or shortness of breath. 11/14/18   Pricilla Loveless, MD  cetirizine (ZYRTEC) 10 MG tablet Take 1 tablet (10 mg total) by mouth daily. 08/27/18   Georgetta Haber, NP  predniSONE (DELTASONE) 20 MG tablet 2 tabs po daily x 4 days 11/15/18   Pricilla Loveless, MD    Family History Family History  Problem Relation Age of Onset  . Asthma Mother   . Diabetes Father     Social  History Social History   Tobacco Use  . Smoking status: Never Smoker  . Smokeless tobacco: Never Used  Substance Use Topics  . Alcohol use: No  . Drug use: No     Allergies   Nsaids; Shellfish allergy; and Oxycodone-acetaminophen   Review of Systems Review of Systems  Constitutional: Negative for fever.  HENT: Positive for congestion.   Respiratory: Positive for cough, chest tightness, shortness of breath and wheezing.   Cardiovascular: Negative for leg swelling.  All other systems reviewed and are negative.    Physical Exam Updated Vital Signs BP (!) 173/92   Pulse 76   Temp 98.5 F (36.9 C) (Oral)   Resp 18   SpO2 98%   Physical Exam Vitals signs and nursing note reviewed.  Constitutional:      General: She is not in acute distress.    Appearance: She is well-developed. She is obese. She is not diaphoretic.  HENT:     Head: Normocephalic and atraumatic.     Right Ear: External ear normal.     Left Ear: External ear normal.     Nose: Nose normal.  Eyes:     General:        Right eye: No discharge.        Left eye: No discharge.  Cardiovascular:     Rate and  Rhythm: Normal rate and regular rhythm.     Heart sounds: Normal heart sounds.  Pulmonary:     Effort: Pulmonary effort is normal. No tachypnea, accessory muscle usage or respiratory distress.     Breath sounds: Wheezing (diffuse, expiratory) present.     Comments: Speaks in complete sentences Abdominal:     General: There is no distension.  Skin:    General: Skin is warm and dry.  Neurological:     Mental Status: She is alert.  Psychiatric:        Mood and Affect: Mood is not anxious.      ED Treatments / Results  Labs (all labs ordered are listed, but only abnormal results are displayed) Labs Reviewed  BASIC METABOLIC PANEL - Abnormal; Notable for the following components:      Result Value   Glucose, Bld 108 (*)    All other components within normal limits  CBC  I-STAT TROPONIN, ED   I-STAT BETA HCG BLOOD, ED ([facility], [facility], [facility] ONLY)    EKG EKG Interpretation  Date/Time:  Tuesday November 14 2018 09:45:28 EST Ventricular Rate:  96 PR Interval:    QRS Duration: 91 QT Interval:  377 QTC Calculation: 477 R Axis:   46 Text Interpretation:  Sinus rhythm Probable anteroseptal infarct, old no acute ST/T changes Confirmed by Pricilla LovelessGoldston, Kani Jobson [phone]([id number]) on 11/14/2018 9:59:11 AM   Radiology Dg Chest 2 View  Result Date: 11/14/2018 CLINICAL DATA:  Pt here for shortness of breath and chest pain for the last 4 hours. Hx of asthma and usees an albuterol inhaler. Pt used last of inhaler this afternoon. Wheezes noted bilaterally. Chest pain occurs when taking a deep breath. EXAM: CHEST - 2 VIEW COMPARISON:  06/27/2018 FINDINGS: The heart size and mediastinal contours are within normal limits. Both lungs are clear. The visualized skeletal structures are unremarkable. IMPRESSION: No active cardiopulmonary disease. Electronically Signed   By: Elige KoHetal  Patel   On: 11/14/2018 00:05    Procedures Procedures (including critical care time)  Medications Ordered in ED Medications  sodium chloride flush (NS) 0.9 % injection 3 mL (3 mLs Intravenous Not Given 11/14/18 0829)  albuterol (PROVENTIL) (2.5 MG/3ML) 0.083% nebulizer solution 5 mg (5 mg Nebulization Given 11/13/18 2350)  albuterol (PROVENTIL) (2.5 MG/3ML) 0.083% nebulizer solution 5 mg (5 mg Nebulization Given 11/14/18 0837)  ipratropium (ATROVENT) nebulizer solution 0.5 mg (0.5 mg Nebulization Given 11/14/18 0837)  predniSONE (DELTASONE) tablet 60 mg (60 mg Oral Given 11/14/18 0837)     Initial Impression / Assessment and Plan / ED Course  I have reviewed the triage vital signs and the nursing notes.  Pertinent labs & imaging results that were available during my care of the patient were reviewed by me and considered in my medical decision making (see chart for details).     Patient feels significantly better after albuterol treatment here.  She  was also given steroids.  This appears to be a recurrent asthma exacerbation.  Unclear what she is using in her nebulizer given its over-the-counter but I will prescribe her albuterol and also prescribe her a steroid burst.  There is no obvious infectious cause such as pneumonia.  Highly doubt ACS, PE.  She appears stable for discharge home and we discussed return precautions.  Final Clinical Impressions(s) / ED Diagnoses   Final diagnoses:  Moderate persistent asthma with exacerbation    ED Discharge Orders         Ordered    predniSONE (DELTASONE) 20 MG  tablet     11/14/18 1007    albuterol (PROVENTIL) (2.5 MG/3ML) 0.083% nebulizer solution  Every 4 hours PRN     11/14/18 1007           Pricilla LovelessGoldston, Senay Sistrunk, MD 11/14/18 1010

## 2018-11-14 NOTE — Discharge Instructions (Signed)
If you develop fever, worsening trouble breathing, chest pain, vomiting, or any other new/concerning symptoms then return to the ER for evaluation.  It is very important to follow-up with a primary care physician.  Start the steroid/prednisone tomorrow as you were given the first dose today.  Use the albuterol nebulizer every 4 hours.

## 2019-08-01 ENCOUNTER — Emergency Department (HOSPITAL_COMMUNITY): Payer: BLUE CROSS/BLUE SHIELD

## 2019-08-01 ENCOUNTER — Encounter (HOSPITAL_COMMUNITY): Payer: Self-pay | Admitting: Emergency Medicine

## 2019-08-01 ENCOUNTER — Emergency Department (HOSPITAL_COMMUNITY)
Admission: EM | Admit: 2019-08-01 | Discharge: 2019-08-01 | Disposition: A | Discharge status: Home / Self Care | Payer: BLUE CROSS/BLUE SHIELD | Attending: Emergency Medicine | Admitting: Emergency Medicine

## 2019-08-01 ENCOUNTER — Other Ambulatory Visit: Payer: Self-pay

## 2019-08-01 DIAGNOSIS — E669 Obesity, unspecified: Secondary | ICD-10-CM | POA: Insufficient documentation

## 2019-08-01 DIAGNOSIS — J4531 Mild persistent asthma with (acute) exacerbation: Secondary | ICD-10-CM | POA: Diagnosis not present

## 2019-08-01 DIAGNOSIS — R0602 Shortness of breath: Secondary | ICD-10-CM | POA: Diagnosis present

## 2019-08-01 DIAGNOSIS — L03116 Cellulitis of left lower limb: Secondary | ICD-10-CM | POA: Diagnosis not present

## 2019-08-01 DIAGNOSIS — Z6841 Body Mass Index (BMI) 40.0 and over, adult: Secondary | ICD-10-CM | POA: Insufficient documentation

## 2019-08-01 LAB — BASIC METABOLIC PANEL
Anion gap: 8 (ref 5–15)
BUN: 8 mg/dL (ref 6–20)
CO2: 27 mmol/L (ref 22–32)
Calcium: 9 mg/dL (ref 8.9–10.3)
Chloride: 102 mmol/L (ref 98–111)
Creatinine, Ser: 0.7 mg/dL (ref 0.44–1.00)
GFR calc Af Amer: >60 mL/min (ref >60)
GFR calc non Af Amer: >60 mL/min (ref >60)
Glucose, Bld: 121 mg/dL — ABNORMAL HIGH (ref 70–99)
Potassium: 3.6 mmol/L (ref 3.5–5.1)
Sodium: 137 mmol/L (ref 135–145)

## 2019-08-01 LAB — CBC WITH DIFFERENTIAL/PLATELET
Abs Immature Granulocytes: 0.03 10*3/uL (ref 0.00–0.07)
Basophils Absolute: 0.1 10*3/uL (ref 0.0–0.1)
Basophils Relative: 1 %
Eosinophils Absolute: 0.8 10*3/uL — ABNORMAL HIGH (ref 0.0–0.5)
Eosinophils Relative: 8 %
HCT: 39.8 % (ref 36.0–46.0)
Hemoglobin: 12.3 g/dL (ref 12.0–15.0)
Immature Granulocytes: 0 %
Lymphocytes Relative: 22 %
Lymphs Abs: 2.2 10*3/uL (ref 0.7–4.0)
MCH: 28.3 pg (ref 26.0–34.0)
MCHC: 30.9 g/dL (ref 30.0–36.0)
MCV: 91.5 fL (ref 80.0–100.0)
Monocytes Absolute: 0.9 10*3/uL (ref 0.1–1.0)
Monocytes Relative: 9 %
Neutro Abs: 6 10*3/uL (ref 1.7–7.7)
Neutrophils Relative %: 60 %
Platelets: 292 10*3/uL (ref 150–400)
RBC: 4.35 MIL/uL (ref 3.87–5.11)
RDW: 14.7 % (ref 11.5–15.5)
WBC: 10.1 10*3/uL (ref 4.0–10.5)
nRBC: 0 % (ref 0.0–0.2)

## 2019-08-01 MED ORDER — ALBUTEROL SULFATE HFA 108 (90 BASE) MCG/ACT IN AERS
4.0000 | INHALATION_SPRAY | Freq: Once | RESPIRATORY_TRACT | Status: AC
  Administered 2019-08-01: 05:00:00 4 via RESPIRATORY_TRACT
  Filled 2019-08-01: qty 6.7

## 2019-08-01 MED ORDER — DEXAMETHASONE SODIUM PHOSPHATE 10 MG/ML IJ SOLN
10.0000 mg | Freq: Once | INTRAMUSCULAR | Status: AC
  Administered 2019-08-01: 10 mg via INTRAVENOUS
  Filled 2019-08-01: qty 1

## 2019-08-01 MED ORDER — DOXYCYCLINE HYCLATE 100 MG PO CAPS: 100.0000 mg | ORAL_CAPSULE | Freq: Two times a day (BID) | ORAL | 0 refills | Status: DC

## 2019-08-01 NOTE — ED Provider Notes (Signed)
[facility]-EMERGENCY DEPT Provider Note   CSN: [id number] Arrival date & time: 08/01/19  0228     History   Chief Complaint Chief Complaint  Patient presents with  . Asthma  . Hypertension    HPI Debbie Vaughn is a 53 y.o. female.     HPI  This is a 53 year old female who presents with asthma and left leg swelling and redness.  Patient reports that her asthma is "acting up."  She has run out of her inhalers.  She reports shortness of breath and wheezing.  No fevers or cough.  She also has noticed redness and increased swelling of the left lower extremity.  She states she has chronic issues with swelling and weeping of that lower extremity and that is not new.  She denies any fevers.  Past Medical History:  Diagnosis Date  . Asthma   . Shrimp allergy     There are no active problems to display for this patient.   Past Surgical History:  Procedure Laterality Date  . CESAREAN SECTION       OB History   No obstetric history on file.      Home Medications    Prior to Admission medications   Medication Sig Start Date End Date Taking? Authorizing Provider  albuterol (PROVENTIL HFA;VENTOLIN HFA) 108 (90 Base) MCG/ACT inhaler Inhale 1-2 puffs into the lungs every 6 (six) hours as needed for wheezing or shortness of breath. 06/28/18  Yes Zadie RhineWickline, Donald, MD  albuterol (PROVENTIL) (2.5 MG/3ML) 0.083% nebulizer solution Take 3 mLs (2.5 mg total) by nebulization every 4 (four) hours as needed for wheezing or shortness of breath. 11/14/18  Yes Pricilla LovelessGoldston, Scott, MD  cetirizine (ZYRTEC) 10 MG tablet Take 1 tablet (10 mg total) by mouth daily. Patient not taking: Reported on 08/01/2019 08/27/18   Linus MakoBurky, Natalie B, NP  doxycycline (VIBRAMYCIN) 100 MG capsule Take 1 capsule (100 mg total) by mouth 2 (two) times daily. 08/01/19   Horton, Mayer Maskerourtney F, MD  predniSONE (DELTASONE) 20 MG tablet 2 tabs po daily x 4 days Patient not taking: Reported on 08/01/2019 11/15/18    Pricilla LovelessGoldston, Scott, MD    Family History Family History  Problem Relation Age of Onset  . Asthma Mother   . Diabetes Father     Social History Social History   Tobacco Use  . Smoking status: Never Smoker  . Smokeless tobacco: Never Used  Substance Use Topics  . Alcohol use: No  . Drug use: No     Allergies   Nsaids, Shellfish allergy, and Oxycodone-acetaminophen   Review of Systems Review of Systems  Constitutional: Negative for fever.  Respiratory: Positive for shortness of breath and wheezing. Negative for cough.   Cardiovascular: Positive for leg swelling. Negative for chest pain.  Gastrointestinal: Negative for abdominal pain.  Musculoskeletal: Negative for back pain.  Skin: Positive for color change.  All other systems reviewed and are negative.    Physical Exam Updated Vital Signs BP (!) 148/71 (BP Location: Right Arm)   Pulse 86   Temp 99.5 F (37.5 C) (Oral)   Resp 20   Ht 1.651 m (5\' 5" )   Wt 122.5 kg   SpO2 96%   BMI 44.93 kg/m   Physical Exam Vitals signs and nursing note reviewed.  Constitutional:      Appearance: She is well-developed. She is obese.  HENT:     Head: Normocephalic and atraumatic.     Mouth/Throat:     Mouth: Mucous  membranes are moist.  Eyes:     Pupils: Pupils are equal, round, and reactive to light.  Neck:     Musculoskeletal: Neck supple.  Cardiovascular:     Rate and Rhythm: Normal rate and regular rhythm.     Heart sounds: Normal heart sounds.  Pulmonary:     Effort: Pulmonary effort is normal. No respiratory distress.     Breath sounds: Wheezing present.     Comments: Good air movement Abdominal:     General: Bowel sounds are normal.     Palpations: Abdomen is soft.     Tenderness: There is no abdominal tenderness.  Musculoskeletal:        General: Swelling present.     Comments: 3+ bilateral lower extremity edema, weeping on the left, there is erythema without significant warmth  Skin:    General: Skin is  warm and dry.     Comments: Erythema left lower extremity greater than right with chronic venous stasis changes  Neurological:     Mental Status: She is alert and oriented to person, place, and time.  Psychiatric:        Mood and Affect: Mood normal.      ED Treatments / Results  Labs (all labs ordered are listed, but only abnormal results are displayed) Labs Reviewed  CBC WITH DIFFERENTIAL/PLATELET - Abnormal; Notable for the following components:      Result Value   Eosinophils Absolute 0.8 (*)    All other components within normal limits  BASIC METABOLIC PANEL - Abnormal; Notable for the following components:   Glucose, Bld 121 (*)    All other components within normal limits    EKG None  Radiology Dg Chest 2 View  Result Date: 08/01/2019 CLINICAL DATA:  Cough. Shortness of breath. EXAM: CHEST - 2 VIEW COMPARISON:  11/14/2018 FINDINGS: Increased bronchial thickening. The cardiomediastinal contours are normal. Pulmonary vasculature is normal. No consolidation, pleural effusion, or pneumothorax. No acute osseous abnormalities are seen. IMPRESSION: Increased bronchial thickening, can be seen with bronchitis or asthma. Electronically Signed   By: Keith Rake M.D.   On: 08/01/2019 03:00    Procedures Procedures (including critical care time)  Medications Ordered in ED Medications  albuterol (VENTOLIN HFA) 108 (90 Base) MCG/ACT inhaler 4 puff (4 puffs Inhalation Given 08/01/19 0431)  dexamethasone (DECADRON) injection 10 mg (10 mg Intravenous Given 08/01/19 0438)     Initial Impression / Assessment and Plan / ED Course  I have reviewed the triage vital signs and the nursing notes.  Pertinent labs & imaging results that were available during my care of the patient were reviewed by me and considered in my medical decision making (see chart for details).        Patient presents with concerns for asthma exacerbation.  Also reports lower extremity swelling and  possible infection.  She is overall nontoxic and vital signs are reassuring.  She is morbidly obese.  She is wheezing on exam but no respiratory distress.  Patient was given 4 puffs of an HFA.  Additionally she was given Decadron.  X-ray without pneumonia.  I did obtain some lab work to ensure no leukocytosis.  She has pretty significant lower extremity swelling and what appears to be chronic venous stasis changes with likely some overlying cellulitis specifically on the left lower extremity.  Basic lab work is largely reassuring.  Will discharge home with HFA inhaler for asthma and doxycycline for cellulitis.  Recommend recheck and patient was given strict return precautions.  After history, exam, and medical workup I feel the patient has been appropriately medically screened and is safe for discharge home. Pertinent diagnoses were discussed with the patient. Patient was given return precautions.   Final Clinical Impressions(s) / ED Diagnoses   Final diagnoses:  Mild persistent asthma with exacerbation  Cellulitis of left lower extremity    ED Discharge Orders         Ordered    doxycycline (VIBRAMYCIN) 100 MG capsule  2 times daily     08/01/19 0729           Horton, Mayer Masker, MD 08/01/19 [phone]

## 2019-08-01 NOTE — ED Notes (Addendum)
Pt currently at 97% SPO2 RA.

## 2019-08-01 NOTE — ED Notes (Signed)
Patient walks with a steady gate and no difficulty.

## 2019-08-01 NOTE — ED Triage Notes (Addendum)
Patient here from work via EMS with complaints of asthma. Reports that she ran out of inhaler around 11am. No PCP, "usually get meds from hospital". Refused steroids.

## 2019-08-28 ENCOUNTER — Emergency Department (HOSPITAL_COMMUNITY)
Admission: EM | Admit: 2019-08-28 | Discharge: 2019-08-28 | Disposition: A | Discharge status: Home / Self Care | Payer: BLUE CROSS/BLUE SHIELD | Attending: Emergency Medicine | Admitting: Emergency Medicine

## 2019-08-28 ENCOUNTER — Emergency Department (HOSPITAL_COMMUNITY): Payer: BLUE CROSS/BLUE SHIELD

## 2019-08-28 ENCOUNTER — Encounter (HOSPITAL_COMMUNITY): Payer: Self-pay | Admitting: Emergency Medicine

## 2019-08-28 ENCOUNTER — Other Ambulatory Visit: Payer: Self-pay

## 2019-08-28 ENCOUNTER — Emergency Department (HOSPITAL_BASED_OUTPATIENT_CLINIC_OR_DEPARTMENT_OTHER): Payer: BLUE CROSS/BLUE SHIELD

## 2019-08-28 DIAGNOSIS — J4520 Mild intermittent asthma, uncomplicated: Secondary | ICD-10-CM | POA: Diagnosis not present

## 2019-08-28 DIAGNOSIS — Z79899 Other long term (current) drug therapy: Secondary | ICD-10-CM | POA: Insufficient documentation

## 2019-08-28 DIAGNOSIS — R609 Edema, unspecified: Secondary | ICD-10-CM | POA: Diagnosis not present

## 2019-08-28 DIAGNOSIS — I878 Other specified disorders of veins: Secondary | ICD-10-CM | POA: Insufficient documentation

## 2019-08-28 DIAGNOSIS — M7989 Other specified soft tissue disorders: Secondary | ICD-10-CM | POA: Diagnosis not present

## 2019-08-28 DIAGNOSIS — J45909 Unspecified asthma, uncomplicated: Secondary | ICD-10-CM | POA: Diagnosis present

## 2019-08-28 LAB — COMPREHENSIVE METABOLIC PANEL
ALT: 25 U/L (ref 0–44)
AST: 27 U/L (ref 15–41)
Albumin: 3.7 g/dL (ref 3.5–5.0)
Alkaline Phosphatase: 65 U/L (ref 38–126)
Anion gap: 9 (ref 5–15)
BUN: 7 mg/dL (ref 6–20)
CO2: 24 mmol/L (ref 22–32)
Calcium: 9 mg/dL (ref 8.9–10.3)
Chloride: 106 mmol/L (ref 98–111)
Creatinine, Ser: 0.59 mg/dL (ref 0.44–1.00)
GFR calc Af Amer: >60 mL/min (ref >60)
GFR calc non Af Amer: >60 mL/min (ref >60)
Glucose, Bld: 98 mg/dL (ref 70–99)
Potassium: 3.5 mmol/L (ref 3.5–5.1)
Sodium: 139 mmol/L (ref 135–145)
Total Bilirubin: 0.7 mg/dL (ref 0.3–1.2)
Total Protein: 7.5 g/dL (ref 6.5–8.1)

## 2019-08-28 LAB — CBC WITH DIFFERENTIAL/PLATELET
Abs Immature Granulocytes: 0.01 10*3/uL (ref 0.00–0.07)
Basophils Absolute: 0.1 10*3/uL (ref 0.0–0.1)
Basophils Relative: 1 %
Eosinophils Absolute: 0.7 10*3/uL — ABNORMAL HIGH (ref 0.0–0.5)
Eosinophils Relative: 10 %
HCT: 39.9 % (ref 36.0–46.0)
Hemoglobin: 12.3 g/dL (ref 12.0–15.0)
Immature Granulocytes: 0 %
Lymphocytes Relative: 29 %
Lymphs Abs: 1.9 10*3/uL (ref 0.7–4.0)
MCH: 28.4 pg (ref 26.0–34.0)
MCHC: 30.8 g/dL (ref 30.0–36.0)
MCV: 92.1 fL (ref 80.0–100.0)
Monocytes Absolute: 0.5 10*3/uL (ref 0.1–1.0)
Monocytes Relative: 7 %
Neutro Abs: 3.4 10*3/uL (ref 1.7–7.7)
Neutrophils Relative %: 53 %
Platelets: 293 10*3/uL (ref 150–400)
RBC: 4.33 MIL/uL (ref 3.87–5.11)
RDW: 14.7 % (ref 11.5–15.5)
WBC: 6.6 10*3/uL (ref 4.0–10.5)
nRBC: 0 % (ref 0.0–0.2)

## 2019-08-28 MED ORDER — ALBUTEROL SULFATE HFA 108 (90 BASE) MCG/ACT IN AERS
4.0000 | INHALATION_SPRAY | Freq: Once | RESPIRATORY_TRACT | Status: AC
  Administered 2019-08-28: 4 via RESPIRATORY_TRACT
  Filled 2019-08-28: qty 6.7

## 2019-08-28 MED ORDER — PREDNISONE 20 MG PO TABS: 40.0000 mg | ORAL_TABLET | Freq: Every day | ORAL | 0 refills | Status: AC

## 2019-08-28 MED ORDER — HYDROXYZINE HCL 25 MG PO TABS
25.0000 mg | ORAL_TABLET | Freq: Once | ORAL | Status: AC
  Administered 2019-08-28: 10:00:00 25 mg via ORAL
  Filled 2019-08-28: qty 1

## 2019-08-28 NOTE — ED Provider Notes (Signed)
[facility] EMERGENCY DEPARTMENT Provider Note   CSN: [id number] Arrival date & time: 08/28/19  4098     History   Chief Complaint Chief Complaint  Patient presents with   Asthma    HPI Debbie Vaughn is a 53 y.o. female.     53 y.o female with a PMH of Asthma, Chronic venous stasis presents to the ED via EMS with a chief complaint of asthma. Patient reports she has been using her inhaler more frequently, she reports she has run out of her butyryl inhaler.  She also reports she wanted to get her left leg evaluated.  She was seen here in the past for a left leg cellulitis, she has completed a full dose of doxycycline with some improvement to her leg although it is persistently swollen.  He denies any COVID-19 exposures, fevers, coughs, chest pain, shortness of breath. No admissions for asthma exacerbation on her chart.  The history is provided by the patient.    Past Medical History:  Diagnosis Date   Asthma    Shrimp allergy     There are no active problems to display for this patient.   Past Surgical History:  Procedure Laterality Date   CESAREAN SECTION       OB History   No obstetric history on file.      Home Medications    Prior to Admission medications   Medication Sig Start Date End Date Taking? Authorizing Provider  albuterol (PROVENTIL HFA;VENTOLIN HFA) 108 (90 Base) MCG/ACT inhaler Inhale 1-2 puffs into the lungs every 6 (six) hours as needed for wheezing or shortness of breath. 06/28/18   Zadie Rhine, MD  albuterol (PROVENTIL) (2.5 MG/3ML) 0.083% nebulizer solution Take 3 mLs (2.5 mg total) by nebulization every 4 (four) hours as needed for wheezing or shortness of breath. 11/14/18   Pricilla Loveless, MD  cetirizine (ZYRTEC) 10 MG tablet Take 1 tablet (10 mg total) by mouth daily. Patient not taking: Reported on 08/01/2019 08/27/18   Linus Mako B, NP  doxycycline (VIBRAMYCIN) 100 MG capsule Take 1 capsule (100 mg total) by  mouth 2 (two) times daily. 08/01/19   Horton, Mayer Masker, MD  predniSONE (DELTASONE) 20 MG tablet Take 2 tablets (40 mg total) by mouth daily for 5 days. 08/28/19 09/02/19  Claude Manges, PA-C    Family History Family History  Problem Relation Age of Onset   Asthma Mother    Diabetes Father     Social History Social History   Tobacco Use   Smoking status: Never Smoker   Smokeless tobacco: Never Used  Substance Use Topics   Alcohol use: No   Drug use: No     Allergies   Nsaids, Shellfish allergy, and Oxycodone-acetaminophen   Review of Systems Review of Systems  Constitutional: Negative for chills and fever.  HENT: Negative for ear pain and sore throat.   Eyes: Negative for pain and visual disturbance.  Respiratory: Negative for cough and shortness of breath.   Cardiovascular: Positive for leg swelling (left at baseline). Negative for chest pain and palpitations.  Gastrointestinal: Negative for abdominal pain and vomiting.  Genitourinary: Negative for dysuria and hematuria.  Musculoskeletal: Negative for arthralgias and back pain.  Skin: Negative for color change and rash.  Neurological: Negative for seizures and syncope.  All other systems reviewed and are negative.    Physical Exam Updated Vital Signs BP (!) 152/67    Pulse 76    Temp 97.7 F (36.5 C) (Oral)  Resp 20    Ht  (1.651 m)    Wt 122.5 kg    SpO2 100%    BMI 44.94 kg/m   Physical Exam Vitals signs and nursing note reviewed.  Constitutional:      General: She is not in acute distress.    Appearance: She is well-developed.  HENT:     Head: Normocephalic and atraumatic.     Mouth/Throat:     Mouth: Mucous membranes are moist.     Pharynx: No oropharyngeal exudate.  Eyes:     Pupils: Pupils are equal, round, and reactive to light.  Neck:     Musculoskeletal: Normal range of motion.  Cardiovascular:     Rate and Rhythm: Normal rate and regular rhythm.     Heart sounds: Normal heart  sounds.  Pulmonary:     Effort: Pulmonary effort is normal. No respiratory distress.     Breath sounds: Normal breath sounds.     Comments: No wheezing, slight decrease breath sounds.  No tachypnea. Abdominal:     General: Bowel sounds are normal. There is no distension.     Palpations: Abdomen is soft.     Tenderness: There is no abdominal tenderness.  Musculoskeletal:        General: No tenderness or deformity.     Right lower leg: No edema.     Left lower leg: No edema.     Comments: No bilateral pitting edema, no calf tenderness.  Skin:    General: Skin is warm and dry.     Comments: Left leg appears swollen, with changes consistent with venous stasis.  Neurovascularly intact.  Neurological:     Mental Status: She is alert and oriented to person, place, and time.      ED Treatments / Results  Labs (all labs ordered are listed, but only abnormal results are displayed) Labs Reviewed  CBC WITH DIFFERENTIAL/PLATELET - Abnormal; Notable for the following components:      Result Value   Eosinophils Absolute 0.7 (*)    All other components within normal limits  COMPREHENSIVE METABOLIC PANEL    EKG None  Radiology Dg Chest Portable 1 View  Result Date: 08/28/2019 CLINICAL DATA:  Shortness of breath EXAM: PORTABLE CHEST 1 VIEW COMPARISON:  August 01, 2019 FINDINGS: There is no evident edema or consolidation. Heart is upper normal in size with pulmonary vascularity normal. No adenopathy. No bone lesions. IMPRESSION: Heart upper normal in size.  No edema or consolidation. Electronically Signed   By: Bretta Bang III M.D.   On: 08/28/2019 10:38   Vas [place] Lower Extremity Venous (dvt) (only Mc & Wl 7a-7p)  Result Date: 08/28/2019  Lower Venous Study Indications: Edema, and Swelling.  Limitations: Poor ultrasound/tissue interface and body habitus. Comparison Study: no prior Performing Technologist: Blanch Media RVS  Examination Guidelines: A complete evaluation includes B-mode  imaging, spectral Doppler, color Doppler, and power Doppler as needed of all accessible portions of each vessel. Bilateral testing is considered an integral part of a complete examination. Limited examinations for reoccurring indications may be performed as noted.  +-----+---------------+---------+-----------+----------+--------------+  RIGHT Compressibility Phasicity Spontaneity Properties Thrombus Aging  +-----+---------------+---------+-----------+----------+--------------+  CFV   Full            Yes       Yes                                    +-----+---------------+---------+-----------+----------+--------------+   +---------+---------------+---------+-----------+----------+--------------+  LEFT      Compressibility Phasicity Spontaneity Properties Thrombus Aging  +---------+---------------+---------+-----------+----------+--------------+  CFV       Full            Yes       Yes                                    +---------+---------------+---------+-----------+----------+--------------+  SFJ       Full                                                             +---------+---------------+---------+-----------+----------+--------------+  FV Prox   Full                                                             +---------+---------------+---------+-----------+----------+--------------+  FV Mid    Full                                                             +---------+---------------+---------+-----------+----------+--------------+  FV Distal Full                                                             +---------+---------------+---------+-----------+----------+--------------+  PFV       Full                                                             +---------+---------------+---------+-----------+----------+--------------+  POP       Full            Yes       Yes                                    +---------+---------------+---------+-----------+----------+--------------+  PERO                                                        Not visualized  +---------+---------------+---------+-----------+----------+--------------+  Soleal                                                     Not visualized  +---------+---------------+---------+-----------+----------+--------------+  Summary: Right: No evidence of common femoral vein obstruction. Left: There is no evidence of deep vein thrombosis in the lower extremity. However, portions of this examination were limited- see technologist comments above. No cystic structure found in the popliteal fossa.  *See table(s) above for measurements and observations.    Preliminary     Procedures Procedures (including critical care time)  Medications Ordered in ED Medications  albuterol (VENTOLIN HFA) 108 (90 Base) MCG/ACT inhaler 4 puff (4 puffs Inhalation Given 08/28/19 1023)  hydrOXYzine (ATARAX/VISTARIL) tablet 25 mg (25 mg Oral Given 08/28/19 1023)     Initial Impression / Assessment and Plan / ED Course  I have reviewed the triage vital signs and the nursing notes.  Pertinent labs & imaging results that were available during my care of the patient were reviewed by me and considered in my medical decision making (see chart for details).   Patient with a past medical history of asthma along with chronic venous stasis of her bilateral lower extremities presents to the ED via EMS after running out of her inhaler along with requesting checkup to her left leg.  Patient was seen in the ED a month ago, was given a 7-day course of doxycycline which she completed with improvement in her left leg.  She does report her legs feel somewhat itchy.  She is not having any shortness of breath or chest pain, does report she has been using her inhaler more often due to weather changes.  She arrived in the ED standing at 100% on room air, no tachypnea, blood pressure seem elevated on arrival. She does not have any headache, chest pain, shortness of breath.  My evaluation  there is no inspiratory or expiratory wheezing.  No hypoxia, no tachypnea.  Respirations are 20 even and unlabored.  There is no bilateral pitting edema, no signs of heart failure on her x-ray, no signs of consolidation, no superimposed pneumonia.  CBC is without any leukocytosis, CMP is without any electrolyte derangement, creatinine level is within normal limits.  LFTs are unremarkable.  An ultrasound of her left leg was performed to further rule out any DVT.  Ultrasound is negative for DVT study.  Patient received a replacement inhaler while in the ED along with 4 puffs.  She does not have a prior history of diabetes, will go home on an inhaler along with a short course of steroids to with her breathing.  She does not have any prior admissions due to asthma exacerbation, no prior intubations.  I feel that patient is optimal for outpatient care.  We will also place a social work consult to help with arranging her outpatient follow-up and PCP establishment.  12:59 PM patient has been scheduled at the [facility] health clinic in order to obtain primary care follow-up, she will need to follow-up with them for her high blood pressure along with obtain refills on her inhaler.  She is otherwise in stable condition, no DVT noted on her ultrasound.  Stats are in the 100s with no tachypnea or tachycardia.  Patient stable for discharge.   Portions of this note were generated with Scientist, clinical (histocompatibility and immunogenetics). Dictation errors may occur despite best attempts at proofreading.  Final Clinical Impressions(s) / ED Diagnoses   Final diagnoses:  Mild intermittent asthma without complication  Venous stasis    ED Discharge Orders         Ordered    predniSONE (DELTASONE) 20 MG tablet  Daily     08/28/19 1237  Claude MangesSoto, Erasmo Vertz, PA-C 08/28/19 1301    Tegeler, Canary Brimhristopher J, MD 08/28/19 [phone]

## 2019-08-28 NOTE — Discharge Planning (Signed)
Jimmy Stipes J. Clydene Laming, RN, BSN, [place] [phone]  [facility] set up appointment with [facility] on 12/7 @0930 .

## 2019-08-28 NOTE — Progress Notes (Signed)
CSW received consult for patient to assist with a PCP and her asthma medications. CSW notified RN CM of need.  Madilyn Fireman, MSW, LCSW-A Transitions of Care  Clinical Social Worker  [facility] Emergency Departments  Medical ICU [phone]

## 2019-08-28 NOTE — Discharge Instructions (Signed)
You are provided with an inhaler today, please use this as directed.  I have also prescribed a short course of steroids, please take 2 tablets daily for the next 5 days.  Your chest x-ray today looked within normal limits.  You have an appointment to see a primary care physician scheduled the [place] and wellness clinic on December 7, please attend in order to establish primary care along with refill on medications.

## 2019-08-28 NOTE — Progress Notes (Signed)
Lower extremity venous has been completed.   Preliminary results in CV Proc.   Abram Sander 08/28/2019 11:27 AM

## 2019-08-28 NOTE — ED Triage Notes (Signed)
Pt BIB [facility] from home, c/o intermittent shortness of breath. LS clear, speaking in complete sentences. Reports that she is out of her home inhaler. Also reports that she wants her left leg checked for cellulitis.

## 2019-08-28 NOTE — ED Notes (Signed)
Patient Alert and oriented to baseline. Stable and ambulatory to baseline. Patient verbalized understanding of the discharge instructions.  Patient belongings were taken by the patient.   

## 2019-09-12 DIAGNOSIS — J45909 Unspecified asthma, uncomplicated: Secondary | ICD-10-CM | POA: Diagnosis present

## 2019-09-12 DIAGNOSIS — J4520 Mild intermittent asthma, uncomplicated: Secondary | ICD-10-CM | POA: Insufficient documentation

## 2019-09-12 DIAGNOSIS — Z79899 Other long term (current) drug therapy: Secondary | ICD-10-CM | POA: Insufficient documentation

## 2019-09-13 ENCOUNTER — Emergency Department (HOSPITAL_COMMUNITY)
Admission: EM | Admit: 2019-09-13 | Discharge: 2019-09-13 | Disposition: A | Discharge status: Home / Self Care | Payer: BLUE CROSS/BLUE SHIELD | Attending: Emergency Medicine | Admitting: Emergency Medicine

## 2019-09-13 ENCOUNTER — Encounter (HOSPITAL_COMMUNITY): Payer: Self-pay | Admitting: Emergency Medicine

## 2019-09-13 ENCOUNTER — Other Ambulatory Visit: Payer: Self-pay

## 2019-09-13 ENCOUNTER — Emergency Department (HOSPITAL_COMMUNITY): Payer: BLUE CROSS/BLUE SHIELD

## 2019-09-13 DIAGNOSIS — J4520 Mild intermittent asthma, uncomplicated: Secondary | ICD-10-CM

## 2019-09-13 LAB — CBC WITH DIFFERENTIAL/PLATELET
Abs Immature Granulocytes: 0.03 10*3/uL (ref 0.00–0.07)
Basophils Absolute: 0 10*3/uL (ref 0.0–0.1)
Basophils Relative: 0 %
Eosinophils Absolute: 0.7 10*3/uL — ABNORMAL HIGH (ref 0.0–0.5)
Eosinophils Relative: 7 %
HCT: 37.3 % (ref 36.0–46.0)
Hemoglobin: 11.6 g/dL — ABNORMAL LOW (ref 12.0–15.0)
Immature Granulocytes: 0 %
Lymphocytes Relative: 23 %
Lymphs Abs: 2.3 10*3/uL (ref 0.7–4.0)
MCH: 28.4 pg (ref 26.0–34.0)
MCHC: 31.1 g/dL (ref 30.0–36.0)
MCV: 91.4 fL (ref 80.0–100.0)
Monocytes Absolute: 0.8 10*3/uL (ref 0.1–1.0)
Monocytes Relative: 7 %
Neutro Abs: 6.3 10*3/uL (ref 1.7–7.7)
Neutrophils Relative %: 63 %
Platelets: 278 10*3/uL (ref 150–400)
RBC: 4.08 MIL/uL (ref 3.87–5.11)
RDW: 15 % (ref 11.5–15.5)
WBC: 10.1 10*3/uL (ref 4.0–10.5)
nRBC: 0 % (ref 0.0–0.2)

## 2019-09-13 LAB — BASIC METABOLIC PANEL
Anion gap: 5 (ref 5–15)
BUN: 9 mg/dL (ref 6–20)
CO2: 27 mmol/L (ref 22–32)
Calcium: 8.6 mg/dL — ABNORMAL LOW (ref 8.9–10.3)
Chloride: 109 mmol/L (ref 98–111)
Creatinine, Ser: 0.79 mg/dL (ref 0.44–1.00)
GFR calc Af Amer: >60 mL/min (ref >60)
GFR calc non Af Amer: >60 mL/min (ref >60)
Glucose, Bld: 107 mg/dL — ABNORMAL HIGH (ref 70–99)
Potassium: 4 mmol/L (ref 3.5–5.1)
Sodium: 141 mmol/L (ref 135–145)

## 2019-09-13 MED ORDER — ALBUTEROL SULFATE HFA 108 (90 BASE) MCG/ACT IN AERS
2.0000 | INHALATION_SPRAY | Freq: Once | RESPIRATORY_TRACT | Status: AC
  Administered 2019-09-13: 2 via RESPIRATORY_TRACT
  Filled 2019-09-13: qty 6.7

## 2019-09-13 MED ORDER — ALBUTEROL SULFATE HFA 108 (90 BASE) MCG/ACT IN AERS: 2.0000 | INHALATION_SPRAY | RESPIRATORY_TRACT | 1 refills | Status: DC | PRN

## 2019-09-13 MED ORDER — ALBUTEROL SULFATE (2.5 MG/3ML) 0.083% IN NEBU
5.0000 mg | INHALATION_SOLUTION | Freq: Once | RESPIRATORY_TRACT | Status: AC
  Administered 2019-09-13: 5 mg via RESPIRATORY_TRACT
  Filled 2019-09-13: qty 6

## 2019-09-13 MED ORDER — IPRATROPIUM BROMIDE 0.02 % IN SOLN
0.5000 mg | Freq: Once | RESPIRATORY_TRACT | Status: AC
  Administered 2019-09-13: 0.5 mg via RESPIRATORY_TRACT
  Filled 2019-09-13: qty 2.5

## 2019-09-13 MED ORDER — PREDNISONE 20 MG PO TABS
60.0000 mg | ORAL_TABLET | Freq: Once | ORAL | Status: AC
  Administered 2019-09-13: 60 mg via ORAL
  Filled 2019-09-13: qty 3

## 2019-09-13 MED ORDER — DIPHENHYDRAMINE HCL 25 MG PO CAPS
50.0000 mg | ORAL_CAPSULE | Freq: Once | ORAL | Status: AC
  Administered 2019-09-13: 50 mg via ORAL
  Filled 2019-09-13: qty 2

## 2019-09-13 MED ORDER — PREDNISONE 20 MG PO TABS: 60.0000 mg | ORAL_TABLET | Freq: Every day | ORAL | 0 refills | Status: DC

## 2019-09-13 MED ORDER — ALBUTEROL SULFATE (2.5 MG/3ML) 0.083% IN NEBU: 2.5000 mg | INHALATION_SOLUTION | Freq: Four times a day (QID) | RESPIRATORY_TRACT | 12 refills | Status: DC | PRN

## 2019-09-13 NOTE — ED Provider Notes (Signed)
TIME SEEN: 4:51 AM  CHIEF COMPLAINT: Asthma exacerbation  HPI: Patient is a 53 year old female with history of asthma who presents to the emergency department with an asthma exacerbation for the past day.  Reports wheezing.  Has had nonproductive cough.  No fevers.  No chest discomfort.  Also complains of itching of her bilateral lower extremities.  Recently treated for cellulitis which has improved and states the skin was peeling but is still itching.  No known Covid exposures.  ROS: See HPI Constitutional: no fever  Eyes: no drainage  ENT: no runny nose   Cardiovascular:  no chest pain  Resp: SOB  GI: no vomiting GU: no dysuria Integumentary: no rash  Allergy: no hives  Musculoskeletal: no leg swelling  Neurological: no slurred speech ROS otherwise negative  PAST MEDICAL HISTORY/PAST SURGICAL HISTORY:  Past Medical History:  Diagnosis Date  . Asthma   . Shrimp allergy     MEDICATIONS:  Prior to Admission medications   Medication Sig Start Date End Date Taking? Authorizing Provider  albuterol (PROVENTIL HFA;VENTOLIN HFA) 108 (90 Base) MCG/ACT inhaler Inhale 1-2 puffs into the lungs every 6 (six) hours as needed for wheezing or shortness of breath. 06/28/18   Ripley Fraise, MD  albuterol (PROVENTIL) (2.5 MG/3ML) 0.083% nebulizer solution Take 3 mLs (2.5 mg total) by nebulization every 4 (four) hours as needed for wheezing or shortness of breath. 11/14/18   Sherwood Gambler, MD  cetirizine (ZYRTEC) 10 MG tablet Take 1 tablet (10 mg total) by mouth daily. Patient not taking: Reported on 08/01/2019 08/27/18   Augusto Gamble B, NP  doxycycline (VIBRAMYCIN) 100 MG capsule Take 1 capsule (100 mg total) by mouth 2 (two) times daily. 08/01/19   Horton, Barbette Hair, MD    ALLERGIES:  Allergies  Allergen Reactions  . Nsaids Shortness Of Breath  . Shellfish Allergy Shortness Of Breath and Itching  . Oxycodone-Acetaminophen Itching    SOCIAL HISTORY:  Social History   Tobacco Use   . Smoking status: Never Smoker  . Smokeless tobacco: Never Used  Substance Use Topics  . Alcohol use: No    FAMILY HISTORY: Family History  Problem Relation Age of Onset  . Asthma Mother   . Diabetes Father     EXAM: BP (!) 170/99   Pulse 91   Temp 98.3 F (36.8 C)   Resp 18   SpO2 96%  CONSTITUTIONAL: Alert and oriented and responds appropriately to questions. Well-appearing; well-nourished HEAD: Normocephalic EYES: Conjunctivae clear, pupils appear equal, EOM appear intact ENT: normal nose; moist mucous membranes NECK: Supple, normal ROM CARD: RRR; S1 and S2 appreciated; no murmurs, no clicks, no rubs, no gallops RESP: Normal chest excursion without splinting or tachypnea; breath sounds equal bilaterally, scattered expiratory wheezes, diminished aeration at bases bilaterally, no rhonchi, no rales, no hypoxia or respiratory distress, speaking full sentences ABD/GI: Normal bowel sounds; non-distended; soft, non-tender, no rebound, no guarding, no peritoneal signs, no hepatosplenomegaly BACK:  The back appears normal EXT: Normal ROM in all joints; no deformity noted, no edema; no cyanosis, venous stasis noted to bilateral lower extremities worse on the left, no calf tenderness, no unilateral swelling, eczematous lesions noted to bilateral distal shins, ankles and dorsal feet without signs of superimposed infection SKIN: Normal color for age and race; warm; no rash on exposed skin NEURO: Moves all extremities equally PSYCH: The patient's mood and manner are appropriate.   MEDICAL DECISION MAKING: Patient here with asthma exacerbation.  Will give albuterol, Atrovent, prednisone.  Also  complaining of itching from eczematous-like lesions that have developed after treatment of cellulitis.  No sign of Trudie Buckler syndrome.  No recurrent infection at this time.  Will give dose of Benadryl for symptomatic relief.  ED PROGRESS: Patient's labs are unremarkable.  Chest x-ray obtained in  triage shows airspace opacity at the left lung base that represents atelectasis versus early infectious process.  She has no white count, no fever, no Covid exposures and no productive cough.  I do not think that she has pneumonia.  I do not feel she needs antibiotics.  She reports feeling better her lungs are now clear after nebulizer treatment.  Will discharge with new albuterol inhaler and prednisone burst.  Discussed return precautions.  At this time, I do not feel there is any life-threatening condition present. I have reviewed, interpreted and discussed all results (EKG, imaging, lab, urine as appropriate) and exam findings with patient/family. I have reviewed nursing notes and appropriate previous records.  I feel the patient is safe to be discharged home without further emergent workup and can continue workup as an outpatient as needed. Discussed usual and customary return precautions. Patient/family verbalize understanding and are comfortable with this plan.  Outpatient follow-up has been provided as needed. All questions have been answered.      Debbie Vaughn was evaluated in Emergency Department on 09/13/2019 for the symptoms described in the history of present illness. She was evaluated in the context of the global COVID-19 pandemic, which necessitated consideration that the patient might be at risk for infection with the SARS-CoV-2 virus that causes COVID-19. Institutional protocols and algorithms that pertain to the evaluation of patients at risk for COVID-19 are in a state of rapid change based on information released by regulatory bodies including the CDC and federal and state organizations. These policies and algorithms were followed during the patient's care in the ED.  Patient was seen wearing N95, face shield, gloves.    Ward, Layla Maw, DO 09/13/19 [phone]

## 2019-09-13 NOTE — ED Triage Notes (Signed)
Patient arrived with EMS from home reports asthma attack onset yesterday with occasional dry cough unrelieved by inhaler , denies fever or chills .

## 2019-09-17 ENCOUNTER — Encounter: Payer: Self-pay | Admitting: Critical Care Medicine

## 2019-09-17 ENCOUNTER — Ambulatory Visit: Payer: BLUE CROSS/BLUE SHIELD | Attending: Critical Care Medicine | Admitting: Critical Care Medicine

## 2019-09-17 DIAGNOSIS — J309 Allergic rhinitis, unspecified: Secondary | ICD-10-CM | POA: Insufficient documentation

## 2019-09-17 DIAGNOSIS — J3089 Other allergic rhinitis: Secondary | ICD-10-CM | POA: Diagnosis not present

## 2019-09-17 DIAGNOSIS — J4551 Severe persistent asthma with (acute) exacerbation: Secondary | ICD-10-CM | POA: Diagnosis not present

## 2019-09-17 MED ORDER — FLUTICASONE PROPIONATE 50 MCG/ACT NA SUSP: 2.0000 | Freq: Every day | NASAL | 6 refills | Status: DC

## 2019-09-17 MED ORDER — DULERA 200-5 MCG/ACT IN AERO: 2.0000 | INHALATION_SPRAY | Freq: Two times a day (BID) | RESPIRATORY_TRACT | 3 refills | Status: AC

## 2019-09-17 MED ORDER — AZELASTINE HCL 0.1 % NA SOLN: 2.0000 | Freq: Two times a day (BID) | NASAL | 12 refills | Status: DC

## 2019-09-17 MED ORDER — SALINE SPRAY 0.65 % NA SOLN: 1.0000 | NASAL | 0 refills | Status: DC | PRN

## 2019-09-17 MED ORDER — NEBULIZER SYSTEM ALL-IN-ONE MISC: 1.0000 | Freq: Four times a day (QID) | 0 refills | Status: DC | PRN

## 2019-09-17 NOTE — Progress Notes (Signed)
Patient verified DOB Patient has eaten today. Patient has taken medication today. Patient states medication helps. Patient has triggers from family smoking recreational drugs and cigarettes.

## 2019-09-17 NOTE — Progress Notes (Signed)
Patient ID: Debbie Vaughn, female   DOB: [birth year], 53 y.o.   MRN: [id number] Virtual Visit via Telephone Note  I connected with Debbie Vaughn on 09/17/19 at  9:30 AM EST by telephone and verified that I am speaking with the correct person using two identifiers.   Consent:  I discussed the limitations, risks, security and privacy concerns of performing an evaluation and management service by telephone and the availability of in person appointments. I also discussed with the patient that there may be a patient responsible charge related to this service. The patient expressed understanding and agreed to proceed.  Location of patient: The patient was at home  Location of provider: I was in office  Persons participating in the televisit with the patient.   No one else on the call    History of Present Illness: This is a 53 year old female with longstanding history of asthma and has been to the emergency department on several occasions in October November and currently.  She has had recurrent wheezing and a dry cough without fever.  She also has itching bilateral lower extremities and has had issues with cellulitis and eczema in the lower extremities in the past.  She was Covid negative and a recent ER visit.  The patient had unremarkable chest x-ray to recent emergency room visit  No triggers for this patient include the fact that she has kerosene heat in the place she is living at now.  She also states dairy products chocolate peanut butter flowers and certain seasonings will trigger.  She also has mold in her car.  She states her nebulizer device is not functioning.  She only has the albuterol inhaler as needed.  She was given prednisone from the emergency room and has 2 more days of medication Past Medical History:  Diagnosis Date  . Asthma   . Shrimp allergy     Past Surgical History:  Procedure Laterality Date  . CESAREAN SECTION      Family History  Problem Relation Age of Onset  .  Asthma Mother   . Diabetes Father     Social History   Socioeconomic History  . Marital status: Divorced    Spouse name: Not on file  . Number of children: Not on file  . Years of education: Not on file  . Highest education level: Not on file  Occupational History  . Not on file  Social Needs  . Financial resource strain: Not on file  . Food insecurity    Worry: Not on file    Inability: Not on file  . Transportation needs    Medical: Not on file    Non-medical: Not on file  Tobacco Use  . Smoking status: Never Smoker  . Smokeless tobacco: Never Used  Substance and Sexual Activity  . Alcohol use: No  . Drug use: No  . Sexual activity: Not Currently    Birth control/protection: None  Lifestyle  . Physical activity    Days per week: Not on file    Minutes per session: Not on file  . Stress: Not on file  Relationships  . Social Musicianconnections    Talks on phone: Not on file    Gets together: Not on file    Attends religious service: Not on file    Active member of club or organization: Not on file    Attends meetings of clubs or organizations: Not on file    Relationship status: Not on file  . Intimate partner violence  Fear of current or ex partner: Not on file    Emotionally abused: Not on file    Physically abused: Not on file    Forced sexual activity: Not on file  Other Topics Concern  . Not on file  Social History Narrative  . Not on file    Outpatient Medications Prior to Visit  Medication Sig Dispense Refill  . albuterol (VENTOLIN HFA) 108 (90 Base) MCG/ACT inhaler Inhale 2 puffs into the lungs every 4 (four) hours as needed for wheezing or shortness of breath. 18 g 1  . predniSONE (DELTASONE) 20 MG tablet Take 3 tablets (60 mg total) by mouth daily. 15 tablet 0  . albuterol (PROVENTIL) (2.5 MG/3ML) 0.083% nebulizer solution Take 3 mLs (2.5 mg total) by nebulization every 4 (four) hours as needed for wheezing or shortness of breath. 75 mL 1  . albuterol  (PROVENTIL) (2.5 MG/3ML) 0.083% nebulizer solution Take 3 mLs (2.5 mg total) by nebulization every 6 (six) hours as needed for wheezing or shortness of breath. 75 mL 12   No facility-administered medications prior to visit.     Allergies  Allergen Reactions  . Nsaids Shortness Of Breath  . Shellfish Allergy Shortness Of Breath and Itching  . Oxycodone-Acetaminophen Itching    ROS Review of Systems    Objective:    Physical Exam  There were no vitals taken for this visit. Wt Readings from Last 3 Encounters:  08/28/19 270 lb 1 oz (122.5 kg)  08/01/19 270 lb (122.5 kg)  06/22/18 270 lb (122.5 kg)     Health Maintenance Due  Topic Date Due  . HIV Screening  03/25/1981  . PAP SMEAR-Modifier  03/26/1987  . MAMMOGRAM  03/25/2016  . COLONOSCOPY  03/25/2016    There are no preventive care reminders to display for this patient.  No results found for: TSH Lab Results  Component Value Date   WBC 10.1 09/13/2019   HGB 11.6 (L) 09/13/2019   HCT 37.3 09/13/2019   MCV 91.4 09/13/2019   PLT 278 09/13/2019   Lab Results  Component Value Date   NA 141 09/13/2019   K 4.0 09/13/2019   CO2 27 09/13/2019   GLUCOSE 107 (H) 09/13/2019   BUN 9 09/13/2019   CREATININE 0.79 09/13/2019   BILITOT 0.7 08/28/2019   ALKPHOS 65 08/28/2019   AST 27 08/28/2019   ALT 25 08/28/2019   PROT 7.5 08/28/2019   ALBUMIN 3.7 08/28/2019   CALCIUM 8.6 (L) 09/13/2019   ANIONGAP 5 09/13/2019   No results found for: CHOL No results found for: HDL No results found for: LDLCALC No results found for: TRIG No results found for: CHOLHDL No results found for: HGBA1C    Assessment & Plan:   Problem List Items Addressed This Visit      Respiratory   Severe persistent asthma with exacerbation - Primary   Relevant Medications   mometasone-formoterol (DULERA) 200-5 MCG/ACT AERO   Other Relevant Orders   Ambulatory referral to Allergy   Allergic rhinitis   Relevant Orders   Ambulatory referral  to Allergy      Meds ordered this encounter  Medications  . Nebulizer System All-In-One MISC    Sig: 1 Dose by Does not apply route 4 (four) times daily as needed (shortness of breath). Use with albuterol    Dispense:  1 each    Refill:  0  . mometasone-formoterol (DULERA) 200-5 MCG/ACT AERO    Sig: Inhale 2 puffs into the lungs 2 (  two) times daily.    Dispense:  13 g    Refill:  3  . azelastine (ASTELIN) 0.1 % nasal spray    Sig: Place 2 sprays into both nostrils 2 (two) times daily. Use in each nostril as directed    Dispense:  30 mL    Refill:  12  . fluticasone (FLONASE) 50 MCG/ACT nasal spray    Sig: Place 2 sprays into both nostrils daily.    Dispense:  16 g    Refill:  6  . sodium chloride (OCEAN) 0.65 % SOLN nasal spray    Sig: Place 1 spray into both nostrils as needed for congestion.    Dispense:  60 mL    Refill:  0   Constitutional:   No  weight loss, night sweats,  Fevers, chills, fatigue, lassitude. HEENT:   No headaches,  Difficulty swallowing,  Tooth/dental problems,  Sore throat,                No sneezing, itching, ear ache, nasal congestion, post nasal drip,   CV:  No chest pain,  Orthopnea, PND, swelling in lower extremities, anasarca, dizziness, palpitations  GI  No heartburn, indigestion, abdominal pain, nausea, vomiting, diarrhea, change in bowel habits, loss of appetite  Resp: N shortness of breath with exertion or at rest.   excess mucus, no productive cough, non-productive cough,  No coughing up of blood.  No change in color of mucus.   wheezing.  No chest wall deformity  Skin:  rash or lesions.  GU: no dysuria, change in color of urine, no urgency or frequency.  No flank pain.  MS:  No joint pain or swelling.  No decreased range of motion.  No back pain.  Psych:  No change in mood or affect. No depression or anxiety.  No memory loss.  Observations/Objective: No observations as this was a telephone visit  Assessment and Plan: #1 severe  persistent asthma with recurrent exacerbation  Plan for this will be to prescribe Astelin nasal spray 2 sprays twice daily each nostril, Flonase 2 sprays daily, saline spray in both nostrils as needed, finish course of prednisone, issue new nebulizer, begin Dulera 200 mcg strength at 2 inhalations twice daily, refill albuterol inhaler  Also we will refer this patient to asthma and allergy Center for further allergic testing and assessment note her eosinophil count was elevated on her CBC recently  Follow Up Instructions: The patient knows an in office exam will be given in January and referral to asthma and allergy will be made   I discussed the assessment and treatment plan with the patient. The patient was provided an opportunity to ask questions and all were answered. The patient agreed with the plan and demonstrated an understanding of the instructions.   The patient was advised to call back or seek an in-person evaluation if the symptoms worsen or if the condition fails to improve as anticipated.  I provided 30  minutes of non-face-to-face time during this encounter  including  median intraservice time , review of notes, labs, imaging, medications  and explaining diagnosis and management to the patient .    Shan Levans, MD

## 2019-10-17 ENCOUNTER — Other Ambulatory Visit: Payer: Self-pay

## 2019-10-17 ENCOUNTER — Ambulatory Visit: Payer: BLUE CROSS/BLUE SHIELD | Attending: Critical Care Medicine | Admitting: Critical Care Medicine

## 2019-10-17 ENCOUNTER — Encounter: Payer: Self-pay | Admitting: Critical Care Medicine

## 2019-10-17 VITALS — BP 140/82 | HR 85 | Resp 16 | Wt 283.0 lb

## 2019-10-17 DIAGNOSIS — J3089 Other allergic rhinitis: Secondary | ICD-10-CM | POA: Diagnosis not present

## 2019-10-17 DIAGNOSIS — J4551 Severe persistent asthma with (acute) exacerbation: Secondary | ICD-10-CM | POA: Diagnosis not present

## 2019-10-17 MED ORDER — AEROCHAMBER MV MISC: 0 refills | Status: DC

## 2019-10-17 MED ORDER — NEBULIZER SYSTEM ALL-IN-ONE MISC: 1.0000 | Freq: Four times a day (QID) | 0 refills | Status: AC | PRN

## 2019-10-17 MED ORDER — PREDNISONE 10 MG PO TABS: ORAL_TABLET | ORAL | 0 refills | Status: DC

## 2019-10-17 MED ORDER — ALBUTEROL SULFATE (2.5 MG/3ML) 0.083% IN NEBU: 2.5000 mg | INHALATION_SOLUTION | Freq: Four times a day (QID) | RESPIRATORY_TRACT | 12 refills | Status: DC | PRN

## 2019-10-17 NOTE — Assessment & Plan Note (Signed)
Severe persistent asthmaWith recurrent exacerbation in part due to environmental allergens including exposure to kerosene fumes oil and associated mold exposure in her car  Plan will be to repulse prednisone for 5 days at 40 mg daily, I reinstructed the patient as to the proper use of the [facility] inhaler and she will begin to use this and I will also issued a spacer device for the patient  I am also going to supply albuterol nebulizer solution and along with this a nebulizer machine for this patient  She was instructed to resume her Flonase and Astelin as prescribed

## 2019-10-17 NOTE — Progress Notes (Signed)
Patient ID: Debbie Vaughn, female   DOB: Oct 03, 1966, 54 y.o.   MRN: [id number] 09/17/2019 Televisit: This is a 54 year old female with longstanding history of asthma and has been to the emergency department on several occasions in October November and currently.  She has had recurrent wheezing and a dry cough without fever.  She also has itching bilateral lower extremities and has had issues with cellulitis and eczema in the lower extremities in the past.  She was Covid negative and a recent ER visit.  The patient had unremarkable chest x-ray to recent emergency room visit  No triggers for this patient include the fact that she has kerosene heat in the place she is living at now.  She also states dairy products chocolate peanut butter flowers and certain seasonings will trigger.  She also has mold in her car.  She states her nebulizer device is not functioning.  She only has the albuterol inhaler as needed.  She was given prednisone from the emergency room and has 2 more days of medication   10/17/2019  Since the last visit as noted above the patient is still having difficulty with wheezing.  She lives in a home with kerosene heat and the fumes are difficult.  She also has mold in her car which is a convertible.  She is not been back to the emergency room since I saw her December 7.  She states the [facility] is too strong however when she demonstrates that she is she is not using it properly.  She notes some wheezing and cough.    Past Medical History:  Diagnosis Date  . Asthma   . Shrimp allergy     Past Surgical History:  Procedure Laterality Date  . CESAREAN SECTION      Family History  Problem Relation Age of Onset  . Asthma Mother   . Diabetes Father     Social History   Socioeconomic History  . Marital status: Divorced    Spouse name: Not on file  . Number of children: Not on file  . Years of education: Not on file  . Highest education level: Not on file  Occupational History  .  Not on file  Tobacco Use  . Smoking status: Never Smoker  . Smokeless tobacco: Never Used  Substance and Sexual Activity  . Alcohol use: No  . Drug use: No  . Sexual activity: Not Currently    Birth control/protection: None  Other Topics Concern  . Not on file  Social History Narrative  . Not on file   Social Determinants of Health   Financial Resource Strain:   . Difficulty of Paying Living Expenses: Not on file  Food Insecurity:   . Worried About Programme researcher, broadcasting/film/video in the Last Year: Not on file  . Ran Out of Food in the Last Year: Not on file  Transportation Needs:   . Lack of Transportation (Medical): Not on file  . Lack of Transportation (Non-Medical): Not on file  Physical Activity:   . Days of Exercise per Week: Not on file  . Minutes of Exercise per Session: Not on file  Stress:   . Feeling of Stress : Not on file  Social Connections:   . Frequency of Communication with Friends and Family: Not on file  . Frequency of Social Gatherings with Friends and Family: Not on file  . Attends Religious Services: Not on file  . Active Member of Clubs or Organizations: Not on file  . Attends  Club or Organization Meetings: Not on file  . Marital Status: Not on file  Intimate Partner Violence:   . Fear of Current or Ex-Partner: Not on file  . Emotionally Abused: Not on file  . Physically Abused: Not on file  . Sexually Abused: Not on file    Outpatient Medications Prior to Visit  Medication Sig Dispense Refill  . albuterol (VENTOLIN HFA) 108 (90 Base) MCG/ACT inhaler Inhale 2 puffs into the lungs every 4 (four) hours as needed for wheezing or shortness of breath. 18 g 1  . azelastine (ASTELIN) 0.1 % nasal spray Place 2 sprays into both nostrils 2 (two) times daily. Use in each nostril as directed 30 mL 12  . fluticasone (FLONASE) 50 MCG/ACT nasal spray Place 2 sprays into both nostrils daily. 16 g 6  . mometasone-formoterol (DULERA) 200-5 MCG/ACT AERO Inhale 2 puffs into the  lungs 2 (two) times daily. 13 g 3  . sodium chloride (OCEAN) 0.65 % SOLN nasal spray Place 1 spray into both nostrils as needed for congestion. 60 mL 0  . Nebulizer System All-In-One MISC 1 Dose by Does not apply route 4 (four) times daily as needed (shortness of breath). Use with albuterol 1 each 0  . predniSONE (DELTASONE) 20 MG tablet Take 3 tablets (60 mg total) by mouth daily. (Patient not taking: Reported on 10/17/2019) 15 tablet 0   No facility-administered medications prior to visit.    Allergies  Allergen Reactions  . Nsaids Shortness Of Breath  . Shellfish Allergy Shortness Of Breath and Itching  . Oxycodone-Acetaminophen Itching    ROS Review of Systems  Constitutional: Positive for fatigue. Negative for fever.  HENT: Positive for congestion, postnasal drip and rhinorrhea. Negative for voice change.   Eyes: Negative.   Respiratory: Positive for cough, choking, shortness of breath and wheezing.   Cardiovascular: Negative.   Gastrointestinal: Negative.   Endocrine: Negative.   Genitourinary: Negative.   Musculoskeletal: Negative.   Allergic/Immunologic: Positive for environmental allergies.  Neurological: Negative.   Psychiatric/Behavioral: Negative.       Objective:    Physical Exam  Vitals:   10/17/19 1012  BP: 140/82  Pulse: 85  Resp: 16  SpO2: 95%  Weight: 283 lb (128.4 kg)    Gen: Pleasant, well-nourished, in no distress,  normal affect  ENT: Determinant edema and inflammatory change mouth clear,  oropharynx clear, no postnasal drip  Neck: No JVD, no TMG, no carotid bruits  Lungs: No use of accessory muscles, no dullness to percussion, expiratory wheeze with poor airflow  Cardiovascular: RRR, heart sounds normal, no murmur or gallops, no peripheral edema  Abdomen: soft and NT, no HSM,  BS normal  Musculoskeletal: No deformities, no cyanosis or clubbing  Neuro: alert, non focal  Skin: Warm, no lesions or rashes    BP 140/82   Pulse 85    Resp 16   Wt 283 lb (128.4 kg)   SpO2 95%   BMI 47.09 kg/m  Wt Readings from Last 3 Encounters:  10/17/19 283 lb (128.4 kg)  08/28/19 270 lb 1 oz (122.5 kg)  08/01/19 270 lb (122.5 kg)     Health Maintenance Due  Topic Date Due  . HIV Screening  03/25/1981  . PAP SMEAR-Modifier  03/26/1987  . MAMMOGRAM  03/25/2016  . COLONOSCOPY  03/25/2016    There are no preventive care reminders to display for this patient.  No results found for: TSH Lab Results  Component Value Date   WBC 10.1 09/13/2019  HGB 11.6 (L) 09/13/2019   HCT 37.3 09/13/2019   MCV 91.4 09/13/2019   PLT 278 09/13/2019   Lab Results  Component Value Date   NA 141 09/13/2019   K 4.0 09/13/2019   CO2 27 09/13/2019   GLUCOSE 107 (H) 09/13/2019   BUN 9 09/13/2019   CREATININE 0.79 09/13/2019   BILITOT 0.7 08/28/2019   ALKPHOS 65 08/28/2019   AST 27 08/28/2019   ALT 25 08/28/2019   PROT 7.5 08/28/2019   ALBUMIN 3.7 08/28/2019   CALCIUM 8.6 (L) 09/13/2019   ANIONGAP 5 09/13/2019   No results found for: CHOL No results found for: HDL No results found for: LDLCALC No results found for: TRIG No results found for: CHOLHDL No results found for: HGBA1C    Assessment & Plan:  I personally reviewed all images and lab data in the [facility] system as well as any outside material available during this office visit and agree with the  radiology impressions.   Severe persistent asthma with exacerbation Severe persistent asthmaWith recurrent exacerbation in part due to environmental allergens including exposure to kerosene fumes oil and associated mold exposure in her car  Plan will be to repulse prednisone for 5 days at 40 mg daily, I reinstructed the patient as to the proper use of the [facility] inhaler and she will begin to use this and I will also issued a spacer device for the patient  I am also going to supply albuterol nebulizer solution and along with this a nebulizer machine for this patient  She was  instructed to resume her Flonase and Astelin as prescribed   Raini was seen today for asthma.  Diagnoses and all orders for this visit:  Severe persistent asthma with exacerbation  Non-seasonal allergic rhinitis due to other allergic trigger  Other orders -     Nebulizer System All-In-One MISC; 1 Dose by Does not apply route 4 (four) times daily as needed (shortness of breath). Use with albuterol -     Spacer/Aero-Holding Chambers (AEROCHAMBER MV) inhaler; Use as instructed -     predniSONE (DELTASONE) 10 MG tablet; Take 4 tablets daily for 5 days then stop -     albuterol (PROVENTIL) (2.5 MG/3ML) 0.083% nebulizer solution; Take 3 mLs (2.5 mg total) by nebulization every 6 (six) hours as needed for wheezing or shortness of breath.   The patient is very complicated and very high risk for recurrent ER admissions I spent extra time today at least 15 minutes of which showing the patient the proper use of the inhalers and the need to be adherent to the [facility] and nasal sprays

## 2019-10-17 NOTE — Patient Instructions (Signed)
We are happy to serve as her primary care physician let us know and we can schedule a future appointment with your primary care provider  Repeat cycle prednisone 40 mg a day for 5 days  Usual Dulera using an AeroChamber spacer device 2 inhalations twice daily as I instructed you today  Refills on albuterol for your nebulizer and a new nebulizer machine was issued  Please take the Astelin twice daily and the Flonase daily as prescribed  Return in follow-up 1 month with dr Delford Field for asthma care

## 2019-10-18 MED ORDER — TRIAMCINOLONE ACETONIDE 0.1 % EX CREA: 1.0000 "application " | TOPICAL_CREAM | Freq: Two times a day (BID) | CUTANEOUS | 0 refills | Status: DC

## 2019-10-18 NOTE — Addendum Note (Signed)
Addended by: Shan Levans E on: 10/18/2019 07:04 AM   Modules accepted: Orders

## 2019-10-30 ENCOUNTER — Ambulatory Visit: Payer: Self-pay | Admitting: Allergy & Immunology

## 2019-11-14 ENCOUNTER — Ambulatory Visit: Payer: BLUE CROSS/BLUE SHIELD | Attending: Critical Care Medicine | Admitting: Critical Care Medicine

## 2019-11-14 ENCOUNTER — Other Ambulatory Visit: Payer: Self-pay

## 2019-11-14 NOTE — Progress Notes (Unsigned)
Patient ID: Debbie Vaughn, female   DOB: March 19, 1966, 54 y.o.   MRN: [id number] 09/17/2019 Televisit: This is a 53 year old female with longstanding history of asthma and has been to the emergency department on several occasions in October November and currently.  She has had recurrent wheezing and a dry cough without fever.  She also has itching bilateral lower extremities and has had issues with cellulitis and eczema in the lower extremities in the past.  She was Covid negative and a recent ER visit.  The patient had unremarkable chest x-ray to recent emergency room visit  No triggers for this patient include the fact that she has kerosene heat in the place she is living at now.  She also states dairy products chocolate peanut butter flowers and certain seasonings will trigger.  She also has mold in her car.  She states her nebulizer device is not functioning.  She only has the albuterol inhaler as needed.  She was given prednisone from the emergency room and has 2 more days of medication   11/14/2019  Since the last visit as noted above the patient is still having difficulty with wheezing.  She lives in a home with kerosene heat and the fumes are difficult.  She also has mold in her car which is a convertible.  She is not been back to the emergency room since I saw her December 7.  She states the [facility] is too strong however when she demonstrates that she is she is not using it properly.  She notes some wheezing and cough.  11/14/2019  Televisit    Past Medical History:  Diagnosis Date  . Asthma   . Shrimp allergy     Past Surgical History:  Procedure Laterality Date  . CESAREAN SECTION      Family History  Problem Relation Age of Onset  . Asthma Mother   . Diabetes Father     Social History   Socioeconomic History  . Marital status: Divorced    Spouse name: Not on file  . Number of children: Not on file  . Years of education: Not on file  . Highest education level: Not on file   Occupational History  . Not on file  Tobacco Use  . Smoking status: Never Smoker  . Smokeless tobacco: Never Used  Substance and Sexual Activity  . Alcohol use: No  . Drug use: No  . Sexual activity: Not Currently    Birth control/protection: None  Other Topics Concern  . Not on file  Social History Narrative  . Not on file   Social Determinants of Health   Financial Resource Strain:   . Difficulty of Paying Living Expenses: Not on file  Food Insecurity:   . Worried About Charity fundraiser in the Last Year: Not on file  . Ran Out of Food in the Last Year: Not on file  Transportation Needs:   . Lack of Transportation (Medical): Not on file  . Lack of Transportation (Non-Medical): Not on file  Physical Activity:   . Days of Exercise per Week: Not on file  . Minutes of Exercise per Session: Not on file  Stress:   . Feeling of Stress : Not on file  Social Connections:   . Frequency of Communication with Friends and Family: Not on file  . Frequency of Social Gatherings with Friends and Family: Not on file  . Attends Religious Services: Not on file  . Active Member of Clubs or Organizations: Not on  file  . Attends Banker Meetings: Not on file  . Marital Status: Not on file  Intimate Partner Violence:   . Fear of Current or Ex-Partner: Not on file  . Emotionally Abused: Not on file  . Physically Abused: Not on file  . Sexually Abused: Not on file    Outpatient Medications Prior to Visit  Medication Sig Dispense Refill  . albuterol (PROVENTIL) (2.5 MG/3ML) 0.083% nebulizer solution Take 3 mLs (2.5 mg total) by nebulization every 6 (six) hours as needed for wheezing or shortness of breath. 75 mL 12  . albuterol (VENTOLIN HFA) 108 (90 Base) MCG/ACT inhaler Inhale 2 puffs into the lungs every 4 (four) hours as needed for wheezing or shortness of breath. 18 g 1  . azelastine (ASTELIN) 0.1 % nasal spray Place 2 sprays into both nostrils 2 (two) times daily. Use in  each nostril as directed 30 mL 12  . fluticasone (FLONASE) 50 MCG/ACT nasal spray Place 2 sprays into both nostrils daily. 16 g 6  . mometasone-formoterol (DULERA) 200-5 MCG/ACT AERO Inhale 2 puffs into the lungs 2 (two) times daily. 13 g 3  . Nebulizer System All-In-One MISC 1 Dose by Does not apply route 4 (four) times daily as needed (shortness of breath). Use with albuterol 1 each 0  . predniSONE (DELTASONE) 10 MG tablet Take 4 tablets daily for 5 days then stop 20 tablet 0  . sodium chloride (OCEAN) 0.65 % SOLN nasal spray Place 1 spray into both nostrils as needed for congestion. 60 mL 0  . Spacer/Aero-Holding Chambers (AEROCHAMBER MV) inhaler Use as instructed 1 each 0  . triamcinolone cream (KENALOG) 0.1 % Apply 1 application topically 2 (two) times daily. 30 g 0   No facility-administered medications prior to visit.    Allergies  Allergen Reactions  . Nsaids Shortness Of Breath  . Shellfish Allergy Shortness Of Breath and Itching  . Oxycodone-Acetaminophen Itching    ROS Review of Systems  Constitutional: Positive for fatigue. Negative for fever.  HENT: Positive for congestion, postnasal drip and rhinorrhea. Negative for voice change.   Eyes: Positive for itching.  Respiratory: Positive for cough, choking, shortness of breath and wheezing.   Cardiovascular: Negative.   Gastrointestinal: Negative.   Endocrine: Negative.   Genitourinary: Negative.   Musculoskeletal: Negative.   Allergic/Immunologic: Positive for environmental allergies.  Neurological: Negative.   Psychiatric/Behavioral: Negative.       Objective:    Physical Exam  There were no vitals filed for this visit.  Gen: Pleasant, well-nourished, in no distress,  normal affect  ENT: Determinant edema and inflammatory change mouth clear,  oropharynx clear, no postnasal drip  Neck: No JVD, no TMG, no carotid bruits  Lungs: No use of accessory muscles, no dullness to percussion, expiratory wheeze with poor  airflow  Cardiovascular: RRR, heart sounds normal, no murmur or gallops, no peripheral edema  Abdomen: soft and NT, no HSM,  BS normal  Musculoskeletal: No deformities, no cyanosis or clubbing  Neuro: alert, non focal  Skin: Warm, no lesions or rashes    There were no vitals taken for this visit. Wt Readings from Last 3 Encounters:  10/17/19 283 lb (128.4 kg)  08/28/19 270 lb 1 oz (122.5 kg)  08/01/19 270 lb (122.5 kg)     Health Maintenance Due  Topic Date Due  . HIV Screening  03/25/1981  . PAP SMEAR-Modifier  03/26/1987  . MAMMOGRAM  03/25/2016  . COLONOSCOPY  03/25/2016    There are  no preventive care reminders to display for this patient.  No results found for: TSH Lab Results  Component Value Date   WBC 10.1 09/13/2019   HGB 11.6 (L) 09/13/2019   HCT 37.3 09/13/2019   MCV 91.4 09/13/2019   PLT 278 09/13/2019   Lab Results  Component Value Date   NA 141 09/13/2019   K 4.0 09/13/2019   CO2 27 09/13/2019   GLUCOSE 107 (H) 09/13/2019   BUN 9 09/13/2019   CREATININE 0.79 09/13/2019   BILITOT 0.7 08/28/2019   ALKPHOS 65 08/28/2019   AST 27 08/28/2019   ALT 25 08/28/2019   PROT 7.5 08/28/2019   ALBUMIN 3.7 08/28/2019   CALCIUM 8.6 (L) 09/13/2019   ANIONGAP 5 09/13/2019   No results found for: CHOL No results found for: HDL No results found for: LDLCALC No results found for: TRIG No results found for: CHOLHDL No results found for: QQVZ5G    Assessment & Plan:  I personally reviewed all images and lab data in the [facility] system as well as any outside material available during this office visit and agree with the  radiology impressions.   No problem-specific Assessment & Plan notes found for this encounter.   There are no diagnoses linked to this encounter. The patient is very complicated and very high risk for recurrent ER admissions I spent extra time today at least 15 minutes of which showing the patient the proper use of the inhalers and the need  to be adherent to the [facility] and nasal sprays

## 2019-12-04 IMAGING — CR DG CHEST 2V
2 series · 2 of 2 positions shown · non-contrast
Comparison: 11/14/2018

CLINICAL DATA: Cough. Shortness of breath.

EXAM:
CHEST - 2 VIEW

[w chest pa]
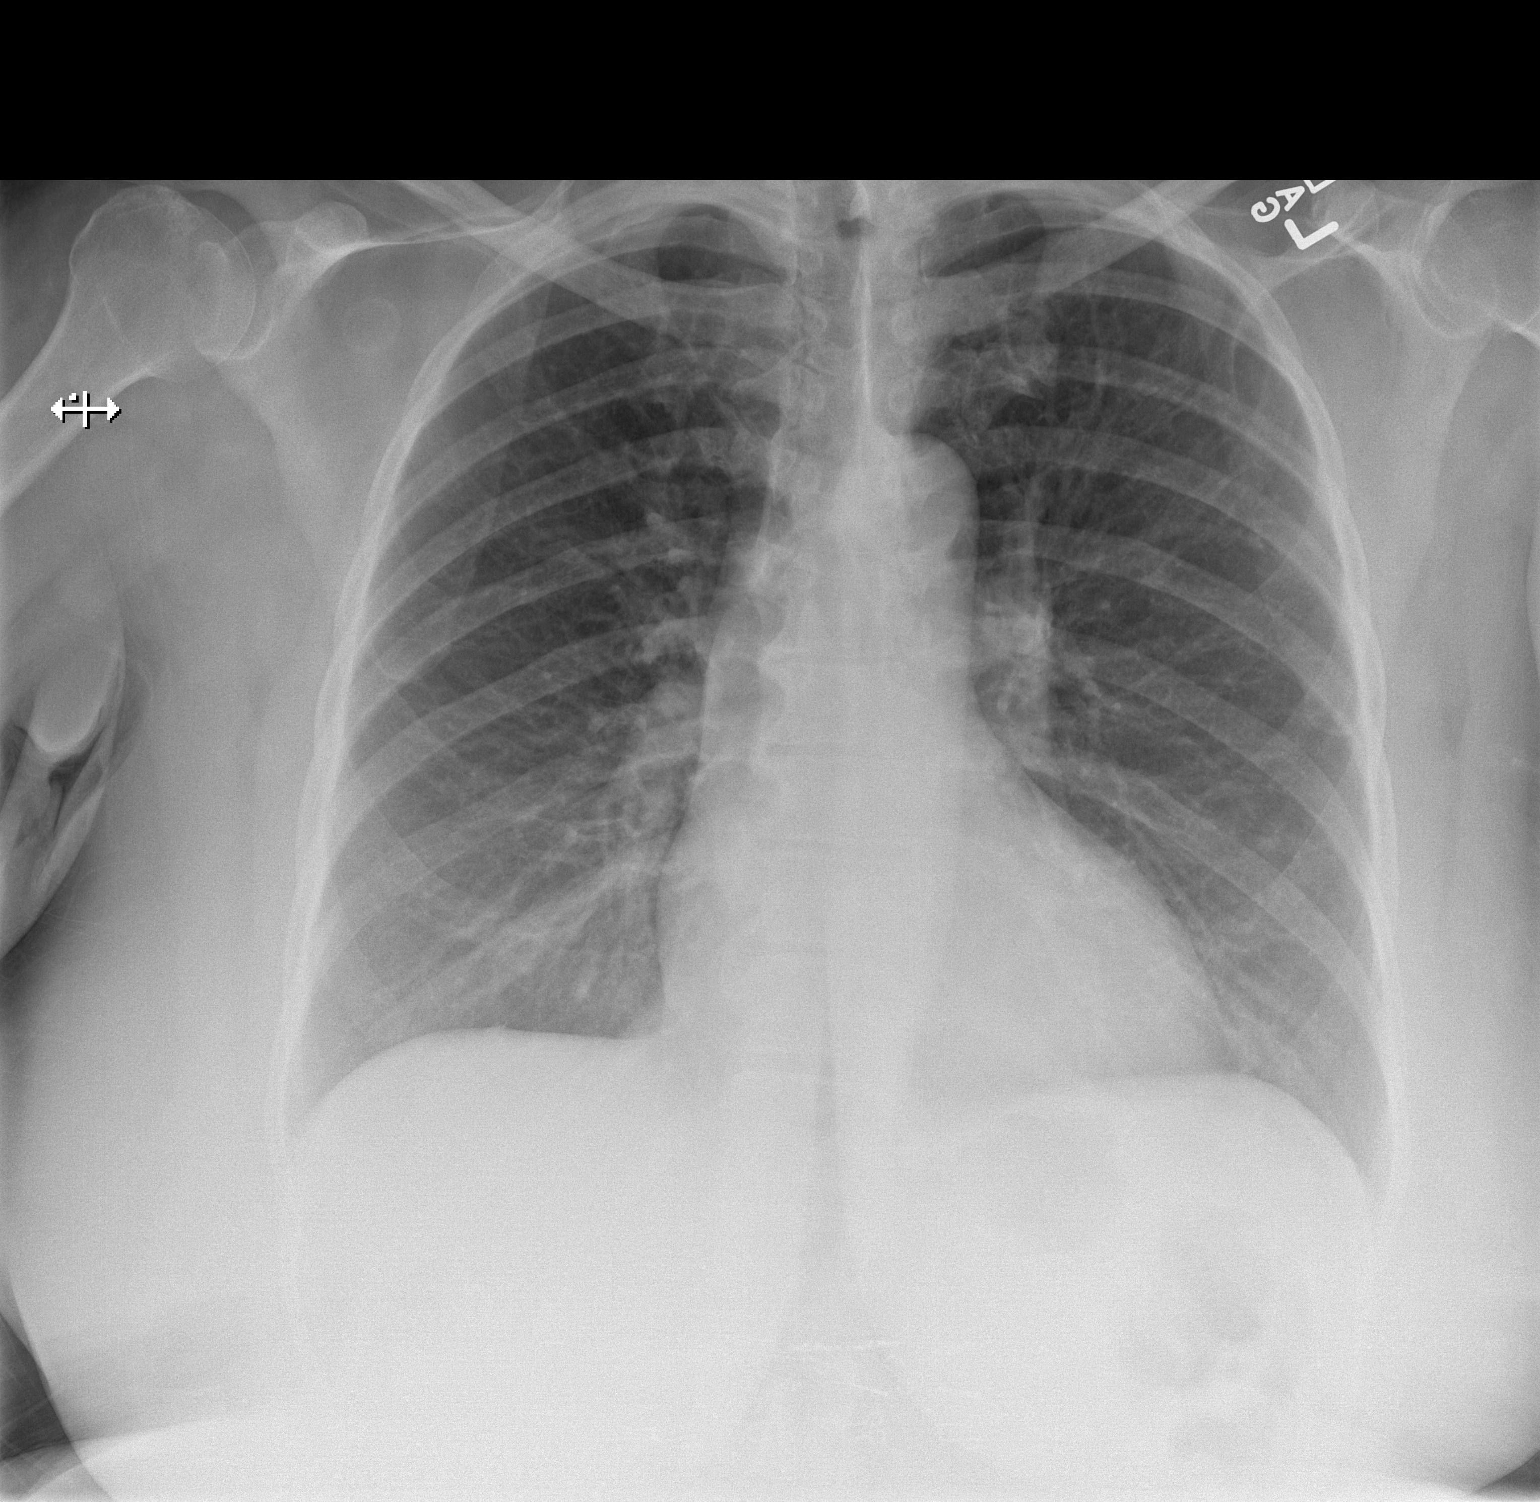

[w chest lat]
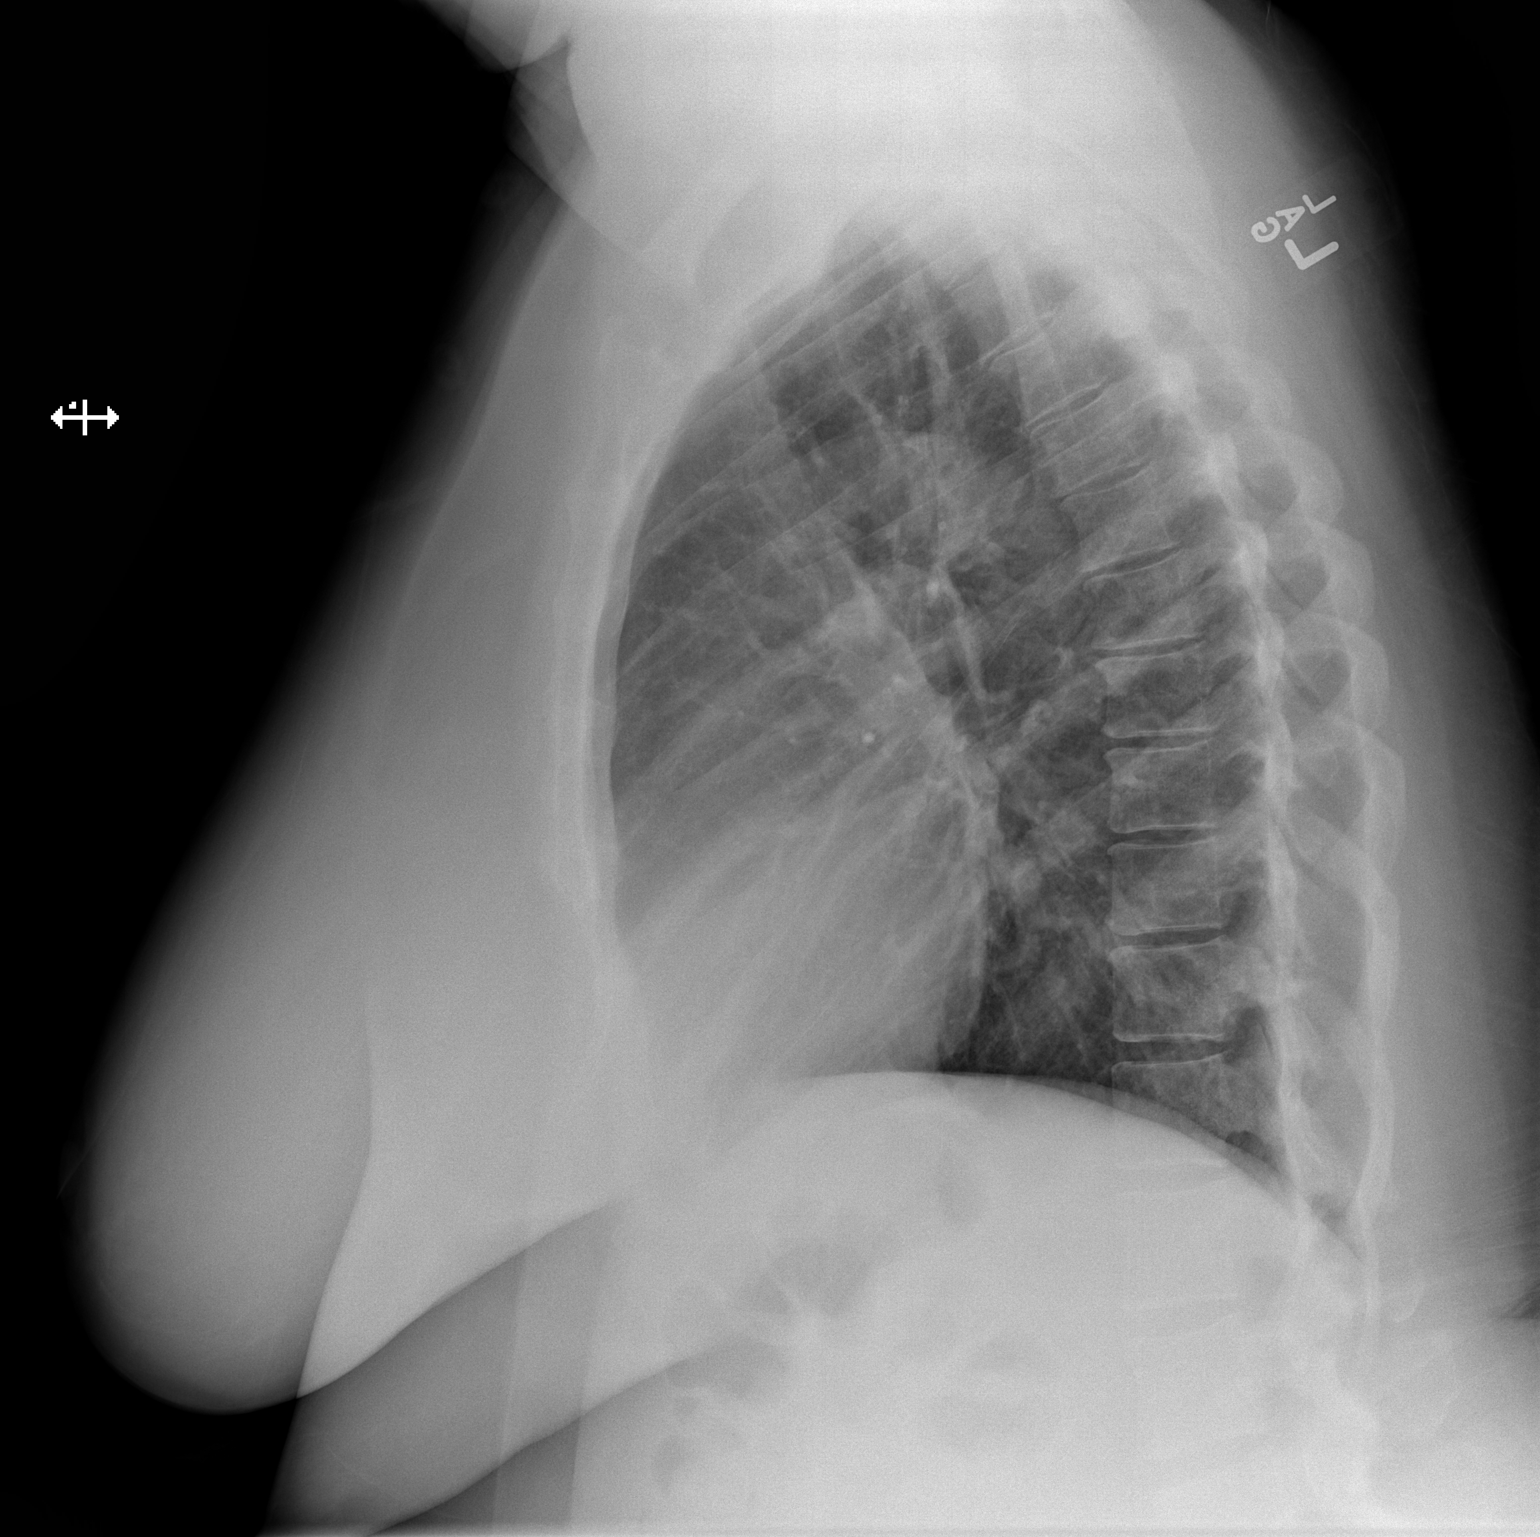

[2 of 2 positions shown; findings below may reference images not displayed]

FINDINGS: Increased bronchial thickening. The cardiomediastinal contours are
normal. Pulmonary vasculature is normal. No consolidation, pleural
effusion, or pneumothorax. No acute osseous abnormalities are seen.
IMPRESSION: Increased bronchial thickening, can be seen with bronchitis or
asthma.

## 2020-01-16 IMAGING — DX DG CHEST 2V
2 series · 2 of 2 positions shown · non-contrast
Comparison: August 28, 2019

CLINICAL DATA: Asthma, wheezing and cough

EXAM:
CHEST - 2 VIEW

[chest pa]
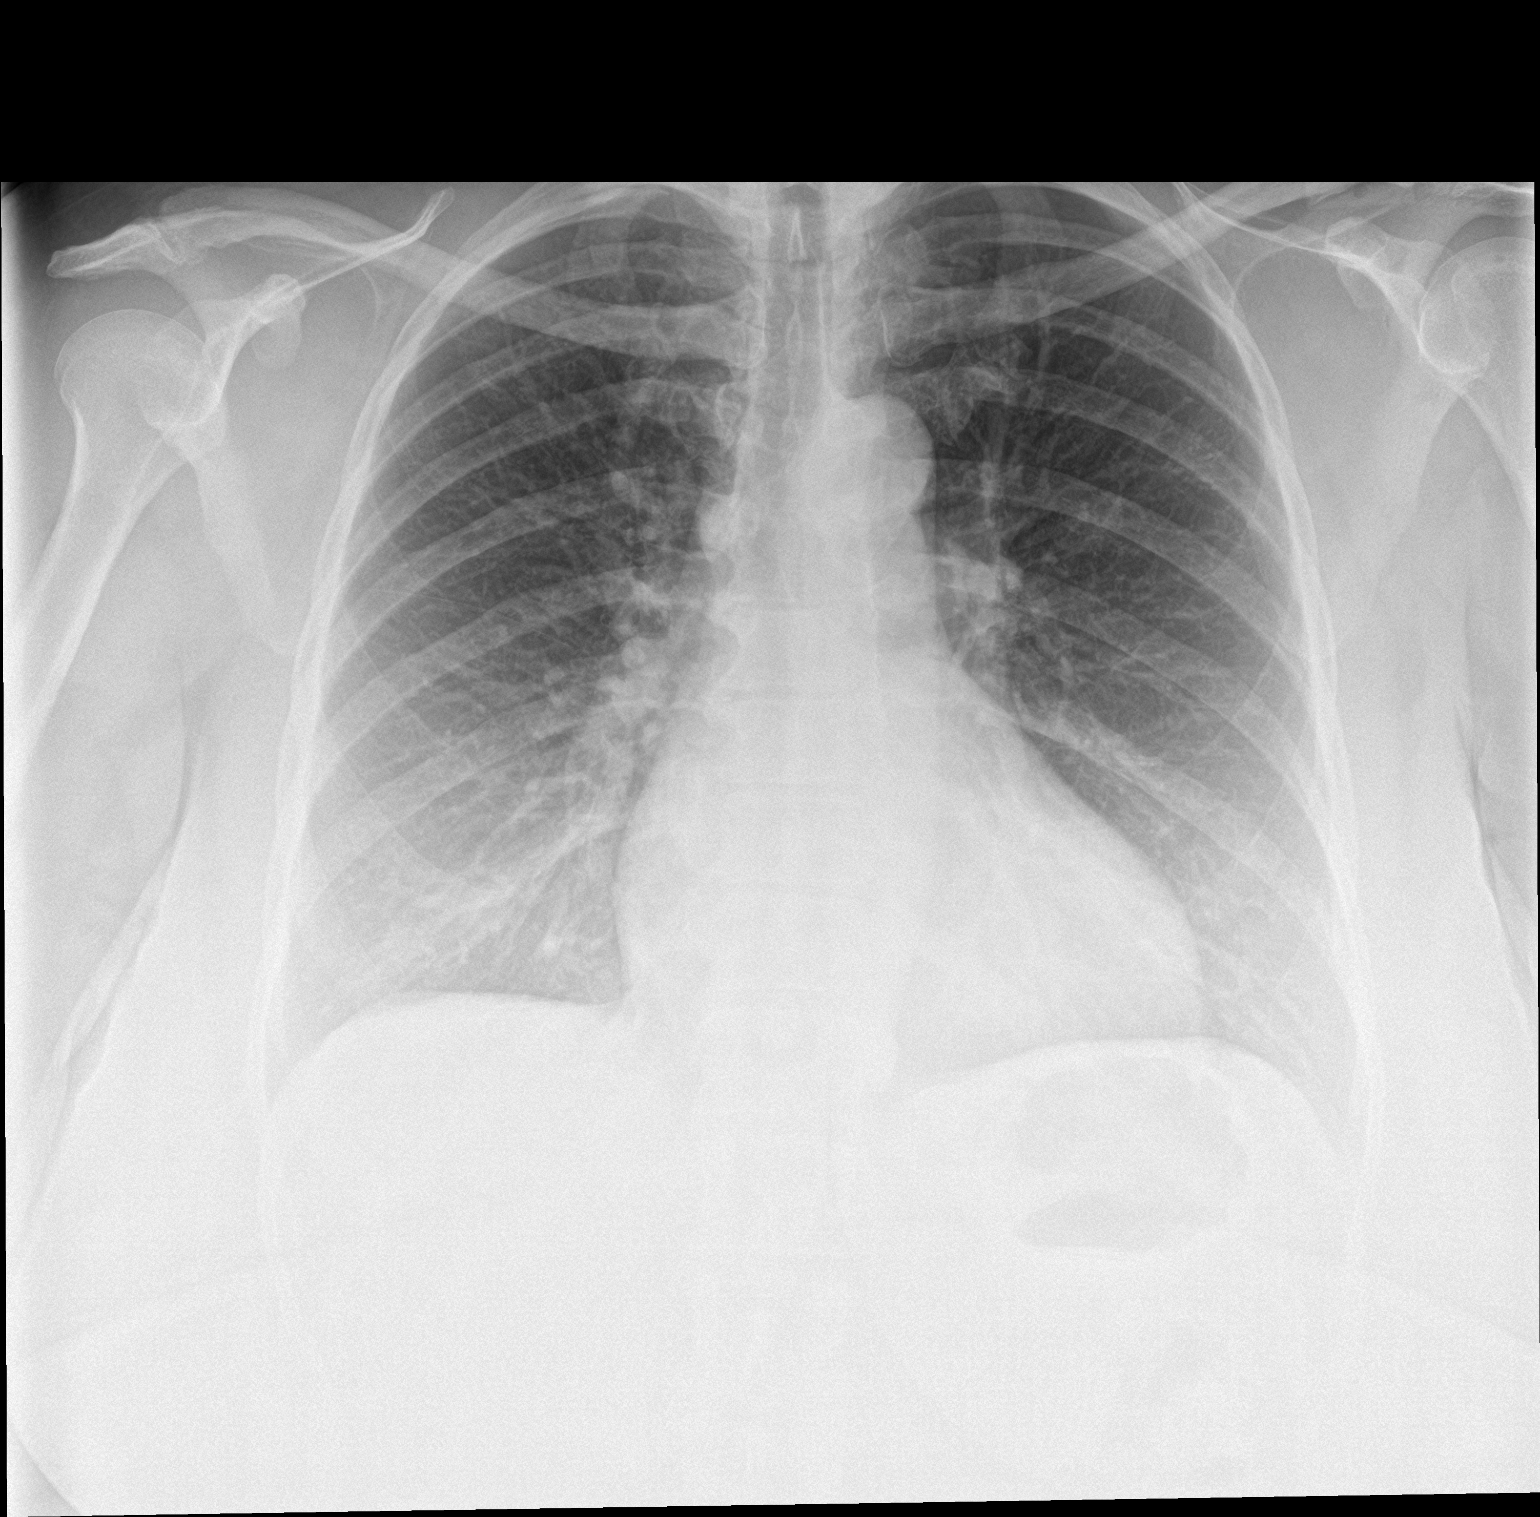

[chest lat]
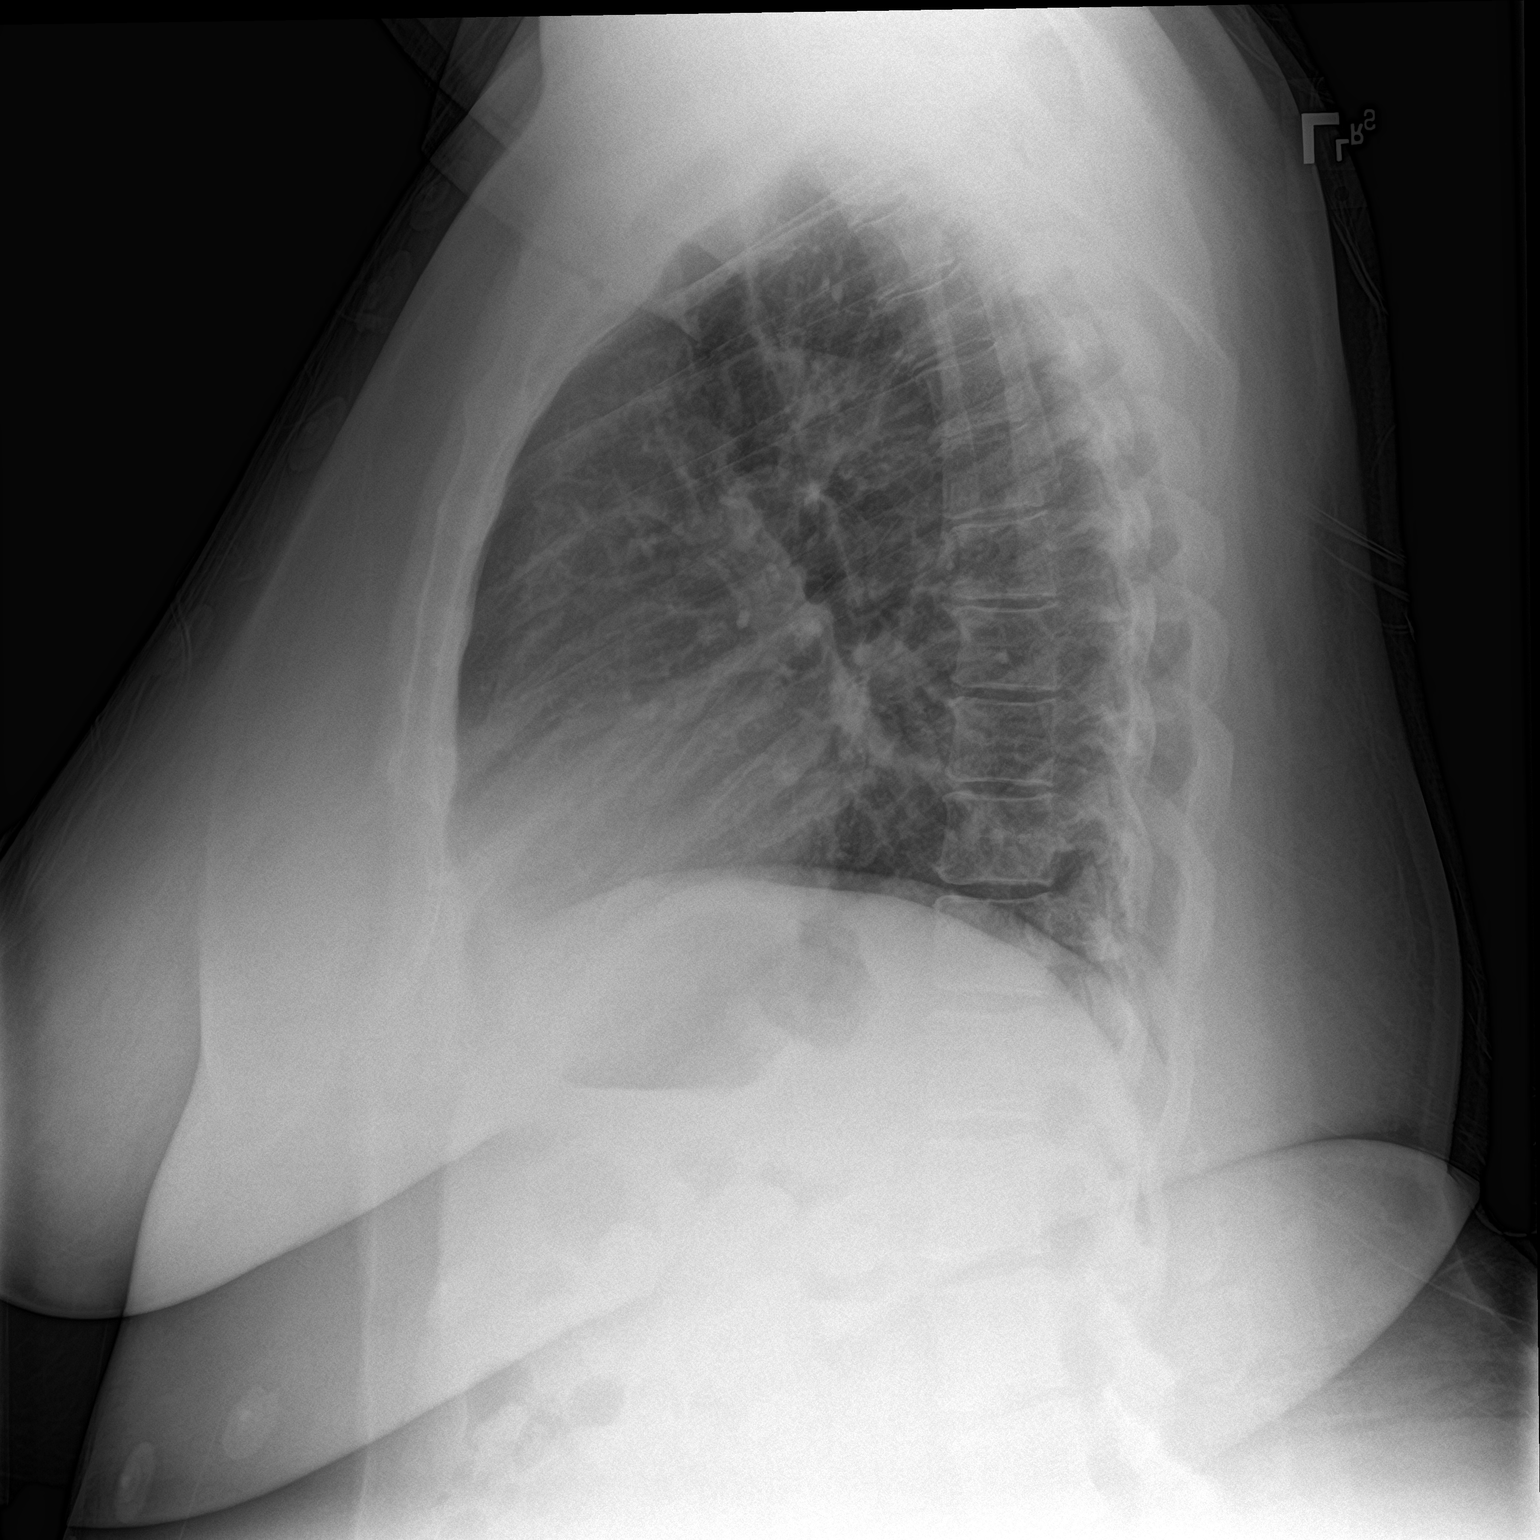

[2 of 2 positions shown; findings below may reference images not displayed]

FINDINGS: The heart size and mediastinal contours are within normal limits.
Minimally increased hazy airspace opacity seen at the right lung
base. The visualized skeletal structures are unremarkable.
IMPRESSION: Mildly increased hazy airspace opacity at the left lung base. This
could be due to atelectasis and/or early infectious etiology.

## 2020-08-18 ENCOUNTER — Other Ambulatory Visit: Payer: Self-pay | Admitting: Critical Care Medicine

## 2020-08-18 NOTE — Telephone Encounter (Signed)
Requested Prescriptions  Pending Prescriptions Disp Refills  . albuterol (PROVENTIL) (2.5 MG/3ML) 0.083% nebulizer solution [Pharmacy Med Name: Albuterol Sulfate (2.5 MG/3ML) 0.083% Inhalation Nebulization Solution] 75 mL 1    Sig: USE 1 VIAL IN NEBULIZER EVERY 6 HOURS AS NEEDED FOR WHEEZING OR SHORTNESS OF BREATH     Pulmonology:  Beta Agonists Failed - 08/18/2020  7:20 PM      Failed - One inhaler should last at least one month. If the patient is requesting refills earlier, contact the patient to check for uncontrolled symptoms.      Passed - Valid encounter within last 12 months    Recent Outpatient Visits          10 months ago Severe persistent asthma with exacerbation   [facility] Storm Frisk, MD   11 months ago Severe persistent asthma with exacerbation   [facility] Storm Frisk, MD             Reminder for patient to call office to schedule an appointment with provider for follow-up.

## 2020-08-22 ENCOUNTER — Other Ambulatory Visit: Payer: Self-pay

## 2020-08-22 ENCOUNTER — Emergency Department (HOSPITAL_COMMUNITY)
Admission: EM | Admit: 2020-08-22 | Discharge: 2020-08-22 | Disposition: A | Discharge status: Home / Self Care | Payer: BLUE CROSS/BLUE SHIELD | Attending: Emergency Medicine | Admitting: Emergency Medicine

## 2020-08-22 ENCOUNTER — Encounter (HOSPITAL_COMMUNITY): Payer: Self-pay | Admitting: Emergency Medicine

## 2020-08-22 DIAGNOSIS — J45901 Unspecified asthma with (acute) exacerbation: Secondary | ICD-10-CM | POA: Insufficient documentation

## 2020-08-22 DIAGNOSIS — J4521 Mild intermittent asthma with (acute) exacerbation: Secondary | ICD-10-CM

## 2020-08-22 DIAGNOSIS — Z7952 Long term (current) use of systemic steroids: Secondary | ICD-10-CM | POA: Insufficient documentation

## 2020-08-22 MED ORDER — AEROCHAMBER PLUS FLO-VU LARGE MISC
Status: AC
  Filled 2020-08-22: qty 1

## 2020-08-22 MED ORDER — MOMETASONE FURO-FORMOTEROL FUM 200-5 MCG/ACT IN AERO
2.0000 | INHALATION_SPRAY | Freq: Once | RESPIRATORY_TRACT | Status: AC
  Administered 2020-08-22: 2 via RESPIRATORY_TRACT
  Filled 2020-08-22: qty 8.8

## 2020-08-22 MED ORDER — IPRATROPIUM BROMIDE 0.02 % IN SOLN
1.0000 mg | Freq: Once | RESPIRATORY_TRACT | Status: AC
  Administered 2020-08-22: 1 mg via RESPIRATORY_TRACT
  Filled 2020-08-22: qty 5

## 2020-08-22 MED ORDER — ALBUTEROL SULFATE (2.5 MG/3ML) 0.083% IN NEBU
5.0000 mg | INHALATION_SOLUTION | Freq: Once | RESPIRATORY_TRACT | Status: AC
  Administered 2020-08-22: 5 mg via RESPIRATORY_TRACT
  Filled 2020-08-22: qty 6

## 2020-08-22 MED ORDER — ALBUTEROL SULFATE HFA 108 (90 BASE) MCG/ACT IN AERS: 2.0000 | INHALATION_SPRAY | RESPIRATORY_TRACT | 0 refills | Status: DC | PRN

## 2020-08-22 MED ORDER — ALBUTEROL SULFATE HFA 108 (90 BASE) MCG/ACT IN AERS
2.0000 | INHALATION_SPRAY | Freq: Once | RESPIRATORY_TRACT | Status: AC
  Administered 2020-08-22: 2 via RESPIRATORY_TRACT
  Filled 2020-08-22: qty 6.7

## 2020-08-22 MED ORDER — METHYLPREDNISOLONE SODIUM SUCC 125 MG IJ SOLR
125.0000 mg | Freq: Once | INTRAMUSCULAR | Status: AC
  Administered 2020-08-22: 125 mg via INTRAVENOUS
  Filled 2020-08-22: qty 2

## 2020-08-22 MED ORDER — PREDNISONE 20 MG PO TABS: 60.0000 mg | ORAL_TABLET | Freq: Every day | ORAL | 0 refills | Status: DC

## 2020-08-22 NOTE — ED Triage Notes (Signed)
Pt bib gems c/o SOB x3 days. 95-98% reported on RA. Per gems pt requested NRB. Hx of asthma, pt reports using over the counter inhaler. 1 duo neb given PTA.

## 2020-08-22 NOTE — ED Provider Notes (Signed)
TIME SEEN: 2:25 AM  CHIEF COMPLAINT: Asthma exacerbation  HPI: Patient is a 54 year old female with history of asthma who presents to the emergency department with 3 days of shortness of breath and wheezing.  Has had nonproductive cough.  No fever.  Reports tightness throughout her lungs.  States this is typical of her asthma exacerbations.  No increased lower extremity swelling or discomfort.  Given 1 DuoNeb with EMS with minimal improvement.  Reports she has albuterol for home but has been out of her [facility] for 2 months.  Is followed by Dr. Delford Field with pulmonology.  States she has been unable to afford her [facility].  ROS: See HPI Constitutional: no fever  Eyes: no drainage  ENT: no runny nose   Cardiovascular:  no chest pain  Resp:  SOB  GI: no vomiting GU: no dysuria Integumentary: no rash  Allergy: no hives  Musculoskeletal: no leg swelling  Neurological: no slurred speech ROS otherwise negative  PAST MEDICAL HISTORY/PAST SURGICAL HISTORY:  Past Medical History:  Diagnosis Date  . Asthma   . Shrimp allergy     MEDICATIONS:  Prior to Admission medications   Medication Sig Start Date End Date Taking? Authorizing Provider  albuterol (PROVENTIL) (2.5 MG/3ML) 0.083% nebulizer solution USE 1 VIAL IN NEBULIZER EVERY 6 HOURS AS NEEDED FOR WHEEZING OR SHORTNESS OF BREATH 08/18/20   Storm Frisk, MD  albuterol (VENTOLIN HFA) 108 (90 Base) MCG/ACT inhaler Inhale 2 puffs into the lungs every 4 (four) hours as needed for wheezing or shortness of breath. 09/13/19   Suri Tafolla, Layla Maw, DO  azelastine (ASTELIN) 0.1 % nasal spray Place 2 sprays into both nostrils 2 (two) times daily. Use in each nostril as directed 09/17/19   Storm Frisk, MD  fluticasone [facility]) 50 MCG/ACT nasal spray Place 2 sprays into both nostrils daily. 09/17/19   Storm Frisk, MD  mometasone-formoterol (DULERA) 200-5 MCG/ACT AERO Inhale 2 puffs into the lungs 2 (two) times daily. 09/17/19   Storm Frisk, MD   Nebulizer System All-In-One MISC 1 Dose by Does not apply route 4 (four) times daily as needed (shortness of breath). Use with albuterol 10/17/19   Storm Frisk, MD  predniSONE (DELTASONE) 10 MG tablet Take 4 tablets daily for 5 days then stop 10/17/19   Storm Frisk, MD  sodium chloride (OCEAN) 0.65 % SOLN nasal spray Place 1 spray into both nostrils as needed for congestion. 09/17/19   Storm Frisk, MD  Spacer/Aero-Holding Chambers (AEROCHAMBER MV) inhaler Use as instructed 10/17/19   Storm Frisk, MD  triamcinolone cream (KENALOG) 0.1 % Apply 1 application topically 2 (two) times daily. 10/18/19   Storm Frisk, MD  cetirizine (ZYRTEC) 10 MG tablet Take 1 tablet (10 mg total) by mouth daily. Patient not taking: Reported on 08/01/2019 08/27/18 09/13/19  Georgetta Haber, NP    ALLERGIES:  Allergies  Allergen Reactions  . Nsaids Shortness Of Breath  . Shellfish Allergy Shortness Of Breath and Itching  . Oxycodone-Acetaminophen Itching    SOCIAL HISTORY:  Social History   Tobacco Use  . Smoking status: Never Smoker  . Smokeless tobacco: Never Used  Substance Use Topics  . Alcohol use: No    FAMILY HISTORY: Family History  Problem Relation Age of Onset  . Asthma Mother   . Diabetes Father     EXAM: BP (!) 159/111   Pulse (!) 109   Temp 98.1 F (36.7 C) (Oral)   Resp 15  Ht 5\' 5"  (1.651 m)   Wt 117.9 kg   SpO2 97%   BMI 43.27 kg/m  CONSTITUTIONAL: Alert and oriented and responds appropriately to questions. Well-appearing; well-nourished HEAD: Normocephalic EYES: Conjunctivae clear, pupils appear equal, EOM appear intact ENT: normal nose; moist mucous membranes NECK: Supple, normal ROM CARD: Regular and minimally tachycardic; S1 and S2 appreciated; no murmurs, no clicks, no rubs, no gallops RESP: Normal chest excursion without splinting or tachypnea; breath sounds equal bilaterally with patient is diminished at her bases with instrument expiratory  wheezes heard diffusely, no rhonchi or rales, no hypoxia, speaking full sentences, not in respiratory distress ABD/GI: Normal bowel sounds; non-distended; soft, non-tender, no rebound, no guarding, no peritoneal signs, no hepatosplenomegaly BACK:  The back appears normal EXT: Normal ROM in all joints; no deformity noted, no edema; no cyanosis SKIN: Normal color for age and race; warm; no rash on exposed skin NEURO: Moves all extremities equally PSYCH: The patient's mood and manner are appropriate.   MEDICAL DECISION MAKING: Patient here with asthma exacerbation.  History of the same with frequent ER visits.  Will give albuterol, Atrovent, Solu-Medrol.  She is not in distress at this time.  We will also provide her with her [facility] inhaler as well as place a social work consult to help with medications.  Doubt ACS, PE, pneumonia, pneumothorax.  I do not feel she needs labs or imaging at this time unless she does not improve with breathing treatments and steroids.  ED PROGRESS: Patient patient reports feeling better after breathing treatments.  Have provided her inhalers prior to dc.    She has follow-up with pulmonology.  I feel she is safe for discharge.  At this time, I do not feel there is any life-threatening condition present. I have reviewed, interpreted and discussed all results (EKG, imaging, lab, urine as appropriate) and exam findings with patient/family. I have reviewed nursing notes and appropriate previous records.  I feel the patient is safe to be discharged home without further emergent workup and can continue workup as an outpatient as needed. Discussed usual and customary return precautions. Patient/family verbalize understanding and are comfortable with this plan.  Outpatient follow-up has been provided as needed. All questions have been answered.    EKG Interpretation  Date/Time:  Friday August 22 2020 02:18:15 EST Ventricular Rate:  101 PR Interval:    QRS Duration: 87 QT  Interval:  376 QTC Calculation: 488 R Axis:   71 Text Interpretation: Sinus tachycardia Right atrial enlargement Artifact Borderline prolonged QT interval Confirmed by [birth year]1982 [phone]) on 08/22/2020 2:25:49 AM        13/09/2020 was evaluated in Emergency Department on 08/22/2020 for the symptoms described in the history of present illness. She was evaluated in the context of the global COVID-19 pandemic, which necessitated consideration that the patient might be at risk for infection with the SARS-CoV-2 virus that causes COVID-19. Institutional protocols and algorithms that pertain to the evaluation of patients at risk for COVID-19 are in a state of rapid change based on information released by regulatory bodies including the CDC and federal and state organizations. These policies and algorithms were followed during the patient's care in the ED.      Sunil Hue, 13/09/2020, DO 08/22/20 [phone]

## 2020-10-11 ENCOUNTER — Emergency Department (HOSPITAL_COMMUNITY)
Admission: EM | Admit: 2020-10-11 | Discharge: 2020-10-11 | Disposition: A | Discharge status: Home / Self Care | Payer: HRSA Program | Attending: Emergency Medicine | Admitting: Emergency Medicine

## 2020-10-11 ENCOUNTER — Encounter (HOSPITAL_COMMUNITY): Payer: Self-pay | Admitting: Emergency Medicine

## 2020-10-11 ENCOUNTER — Other Ambulatory Visit: Payer: Self-pay

## 2020-10-11 DIAGNOSIS — U071 COVID-19: Secondary | ICD-10-CM

## 2020-10-11 DIAGNOSIS — Z7951 Long term (current) use of inhaled steroids: Secondary | ICD-10-CM | POA: Insufficient documentation

## 2020-10-11 DIAGNOSIS — J4551 Severe persistent asthma with (acute) exacerbation: Secondary | ICD-10-CM | POA: Insufficient documentation

## 2020-10-11 DIAGNOSIS — R070 Pain in throat: Secondary | ICD-10-CM | POA: Diagnosis present

## 2020-10-11 HISTORY — DX: COVID-19: U07.1

## 2020-10-11 LAB — BASIC METABOLIC PANEL
Anion gap: 10 (ref 5–15)
BUN: 7 mg/dL (ref 6–20)
CO2: 28 mmol/L (ref 22–32)
Calcium: 8.9 mg/dL (ref 8.9–10.3)
Chloride: 101 mmol/L (ref 98–111)
Creatinine, Ser: 0.73 mg/dL (ref 0.44–1.00)
GFR, Estimated: >60 mL/min (ref >60)
Glucose, Bld: 116 mg/dL — ABNORMAL HIGH (ref 70–99)
Potassium: 4 mmol/L (ref 3.5–5.1)
Sodium: 139 mmol/L (ref 135–145)

## 2020-10-11 LAB — CBC
HCT: 38.3 % (ref 36.0–46.0)
Hemoglobin: 12.1 g/dL (ref 12.0–15.0)
MCH: 28.5 pg (ref 26.0–34.0)
MCHC: 31.6 g/dL (ref 30.0–36.0)
MCV: 90.3 fL (ref 80.0–100.0)
Platelets: 185 10*3/uL (ref 150–400)
RBC: 4.24 MIL/uL (ref 3.87–5.11)
RDW: 14.6 % (ref 11.5–15.5)
WBC: 5.2 10*3/uL (ref 4.0–10.5)
nRBC: 0 % (ref 0.0–0.2)

## 2020-10-11 LAB — RESP PANEL BY RT-PCR (FLU A&B, COVID) ARPGX2
Influenza A by PCR: NEGATIVE
Influenza B by PCR: NEGATIVE
SARS Coronavirus 2 by RT PCR: POSITIVE — AB

## 2020-10-11 NOTE — Discharge Instructions (Signed)
You have tested positive for COVID-19.  Please follow instruction below for care.  Recommendations for at home COVID-19 symptoms management:  Please continue isolation at home. Call [phone] to see whether you might be eligible for therapeutic antibody infusions (leave your name and they will call you back).  If have acute worsening of symptoms please go to ER/urgent care for further evaluation. Check pulse oximetry and if below 90-92% please go to ER. The following supplements MAY help:  Vitamin C 500mg  twice a day and Quercetin 250-500 mg twice a day Vitamin D3 2000 - 4000 u/day B Complex vitamins Zinc 75-100 mg/day Melatonin 6-10 mg at night (the optimal dose is unknown) Aspirin 81mg /day (if no history of bleeding issues)

## 2020-10-11 NOTE — ED Triage Notes (Signed)
Pt reports generalized weakness x 2 weeks.  States she had a sore throat last week that resolved.  Pt's sister that lives with her is COVID +.

## 2020-10-11 NOTE — ED Provider Notes (Signed)
[facility] EMERGENCY DEPARTMENT Provider Note   CSN: [id number] Arrival date & time: 10/11/20  6644     History Chief Complaint  Patient presents with  . Covid Exposure  . Weakness    Debbie Vaughn is a 55 y.o. female.  The history is provided by the patient and medical records. No language interpreter was used.  Weakness    55 year old female significant history of asthma presenting with cold symptoms.  Patient reports 5 days ago she developed sore throat and generalized body aches.  Sore throat did improved but she feels weak and tired and worn out.  She endorsed some headache, mild increase shortness of breath which she attributes to her asthma.  She does not complain of loss of taste or smell denies nausea vomiting or diarrhea.  She does live with her sister who recently test positive for COVID-19.  She has not been vaccinated for COVID-19.  She did take elderberry and other over-the-counter medication at home with some improvement.  She denies tobacco use.  She does have an inhaler at home that she used on occasion.  Past Medical History:  Diagnosis Date  . Asthma   . Shrimp allergy     Patient Active Problem List   Diagnosis Date Noted  . Severe persistent asthma with exacerbation 09/17/2019  . Allergic rhinitis 09/17/2019    Past Surgical History:  Procedure Laterality Date  . CESAREAN SECTION       OB History   No obstetric history on file.     Family History  Problem Relation Age of Onset  . Asthma Mother   . Diabetes Father     Social History   Tobacco Use  . Smoking status: Never Smoker  . Smokeless tobacco: Never Used  Vaping Use  . Vaping Use: Never used  Substance Use Topics  . Alcohol use: No  . Drug use: No    Home Medications Prior to Admission medications   Medication Sig Start Date End Date Taking? Authorizing Provider  albuterol (PROVENTIL) (2.5 MG/3ML) 0.083% nebulizer solution USE 1 VIAL IN NEBULIZER EVERY 6  HOURS AS NEEDED FOR WHEEZING OR SHORTNESS OF BREATH 08/18/20   Storm Frisk, MD  albuterol (VENTOLIN HFA) 108 (90 Base) MCG/ACT inhaler Inhale 2 puffs into the lungs every 4 (four) hours as needed for wheezing or shortness of breath. 09/13/19   Ward, Layla Maw, DO  albuterol (VENTOLIN HFA) 108 (90 Base) MCG/ACT inhaler Inhale 2 puffs into the lungs every 4 (four) hours as needed for wheezing or shortness of breath. 08/22/20   Ward, Layla Maw, DO  azelastine (ASTELIN) 0.1 % nasal spray Place 2 sprays into both nostrils 2 (two) times daily. Use in each nostril as directed 09/17/19   Storm Frisk, MD  fluticasone [facility]) 50 MCG/ACT nasal spray Place 2 sprays into both nostrils daily. 09/17/19   Storm Frisk, MD  mometasone-formoterol (DULERA) 200-5 MCG/ACT AERO Inhale 2 puffs into the lungs 2 (two) times daily. 09/17/19   Storm Frisk, MD  Nebulizer System All-In-One MISC 1 Dose by Does not apply route 4 (four) times daily as needed (shortness of breath). Use with albuterol 10/17/19   Storm Frisk, MD  predniSONE (DELTASONE) 20 MG tablet Take 3 tablets (60 mg total) by mouth daily. 08/22/20   Ward, Layla Maw, DO  sodium chloride (OCEAN) 0.65 % SOLN nasal spray Place 1 spray into both nostrils as needed for congestion. 09/17/19   Storm Frisk, MD  Spacer/Aero-Holding Chambers (AEROCHAMBER MV) inhaler Use as instructed 10/17/19   Storm Frisk, MD  triamcinolone cream (KENALOG) 0.1 % Apply 1 application topically 2 (two) times daily. 10/18/19   Storm Frisk, MD  cetirizine (ZYRTEC) 10 MG tablet Take 1 tablet (10 mg total) by mouth daily. Patient not taking: Reported on 08/01/2019 08/27/18 09/13/19  Georgetta Haber, NP    Allergies    Nsaids, Shellfish allergy, and Oxycodone-acetaminophen  Review of Systems   Review of Systems  Neurological: Positive for weakness.  All other systems reviewed and are negative.   Physical Exam Updated Vital Signs BP (!) 133/57   Pulse  88   Temp 99.8 F (37.7 C) (Oral)   Resp 20   SpO2 97%   Physical Exam Vitals and nursing note reviewed.  Constitutional:      General: She is not in acute distress.    Appearance: She is well-developed and well-nourished. She is obese.  HENT:     Head: Atraumatic.     Mouth/Throat:     Mouth: Mucous membranes are moist.  Eyes:     Conjunctiva/sclera: Conjunctivae normal.  Cardiovascular:     Rate and Rhythm: Normal rate and regular rhythm.     Pulses: Normal pulses.     Heart sounds: Normal heart sounds.  Pulmonary:     Effort: Pulmonary effort is normal.     Breath sounds: Normal breath sounds. No wheezing, rhonchi or rales.  Musculoskeletal:     Cervical back: Neck supple.     Right lower leg: No edema.     Left lower leg: No edema.  Skin:    Findings: No rash.  Neurological:     Mental Status: She is alert.  Psychiatric:        Mood and Affect: Mood and affect and mood normal.     ED Results / Procedures / Treatments   Labs (all labs ordered are listed, but only abnormal results are displayed) Labs Reviewed  RESP PANEL BY RT-PCR (FLU A&B, COVID) ARPGX2 - Abnormal; Notable for the following components:      Result Value   SARS Coronavirus 2 by RT PCR POSITIVE (*)    All other components within normal limits  BASIC METABOLIC PANEL - Abnormal; Notable for the following components:   Glucose, Bld 116 (*)    All other components within normal limits  CBC  URINALYSIS, ROUTINE W REFLEX MICROSCOPIC    EKG None   Date: 10/11/2020  Rate: 89  Rhythm: normal sinus rhythm  QRS Axis: normal  Intervals: normal  ST/T Wave abnormalities: normal  Conduction Disutrbances: none  Narrative Interpretation:   Old EKG Reviewed: No significant changes noted     Radiology No results found.  Procedures Procedures (including critical care time)  Medications Ordered in ED Medications - No data to display  ED Course  I have reviewed the triage vital signs and the  nursing notes.  Pertinent labs & imaging results that were available during my care of the patient were reviewed by me and considered in my medical decision making (see chart for details).    MDM Rules/Calculators/A&P                          BP (!) 153/90   Pulse 89   Temp 99.8 F (37.7 C) (Oral)   Resp (!) 28   SpO2 96%   Final Clinical Impression(s) / ED Diagnoses Final diagnoses:  COVID-19  virus infection    Rx / DC Orders ED Discharge Orders    None     Patient is unvaccinated for COVID-19 who has recent Covid exposure here with complaints of sore throat and generalized weakness.  Her Covid test is positive her labs are reassuring she is not hypoxic and throat exam unremarkable.  Will discharge home with appropriate care instruction as well as work note.  Return precaution given.  Debbie Vaughn was evaluated in Emergency Department on 10/11/2020 for the symptoms described in the history of present illness. She was evaluated in the context of the global COVID-19 pandemic, which necessitated consideration that the patient might be at risk for infection with the SARS-CoV-2 virus that causes COVID-19. Institutional protocols and algorithms that pertain to the evaluation of patients at risk for COVID-19 are in a state of rapid change based on information released by regulatory bodies including the CDC and federal and state organizations. These policies and algorithms were followed during the patient's care in the ED.    Domenic Moras, PA-C 10/11/20 1140    Lajean Saver, MD 10/11/20 1315

## 2020-10-12 ENCOUNTER — Encounter: Payer: Self-pay | Admitting: Nurse Practitioner

## 2020-10-12 ENCOUNTER — Telehealth: Payer: Self-pay | Admitting: Nurse Practitioner

## 2020-10-12 DIAGNOSIS — J45909 Unspecified asthma, uncomplicated: Secondary | ICD-10-CM | POA: Insufficient documentation

## 2020-10-12 NOTE — Telephone Encounter (Signed)
Called patient to discuss Covid symptoms and the use of casirivimab/imdevimab, a monoclonal antibody infusion for those with mild to moderate Covid symptoms and at a high risk of hospitalization.  Pt is qualified for this infusion at the [facility] due to; Specific high risk criteria : BMI > 25 and Chronic Lung Disease   Message left to call back our hotline [phone].  Nicolasa Ducking, NP

## 2020-10-21 ENCOUNTER — Encounter (HOSPITAL_COMMUNITY): Payer: Self-pay | Admitting: Emergency Medicine

## 2020-10-21 ENCOUNTER — Emergency Department (HOSPITAL_COMMUNITY)
Admission: EM | Admit: 2020-10-21 | Discharge: 2020-10-21 | Disposition: A | Discharge status: Home / Self Care | Payer: Self-pay | Attending: Emergency Medicine | Admitting: Emergency Medicine

## 2020-10-21 ENCOUNTER — Other Ambulatory Visit: Payer: Self-pay

## 2020-10-21 DIAGNOSIS — J4551 Severe persistent asthma with (acute) exacerbation: Secondary | ICD-10-CM | POA: Insufficient documentation

## 2020-10-21 DIAGNOSIS — R531 Weakness: Secondary | ICD-10-CM | POA: Insufficient documentation

## 2020-10-21 DIAGNOSIS — Z7951 Long term (current) use of inhaled steroids: Secondary | ICD-10-CM | POA: Insufficient documentation

## 2020-10-21 DIAGNOSIS — Z8616 Personal history of COVID-19: Secondary | ICD-10-CM | POA: Insufficient documentation

## 2020-10-21 DIAGNOSIS — U071 COVID-19: Secondary | ICD-10-CM

## 2020-10-21 NOTE — ED Triage Notes (Signed)
Per pt, states covid like symptoms which started yesterday-body aches, chills-tested positive on 1/1

## 2020-10-21 NOTE — ED Provider Notes (Signed)
[facility]-EMERGENCY DEPT Provider Note   CSN: [id number] Arrival date & time: 10/21/20  0720     History Chief Complaint  Patient presents with  . possible covid exposure    Debbie Vaughn is a 55 y.o. female.  HPI She presents for evaluation of generalized weakness.  She states this is how she felt when she first had COVID symptoms, on 10/03/2020.  She has been recovering at home.  She has not returned to work because of how she feels.  She states that yesterday she was able to eat and taste food.  She has not yet eaten today.  She denies fever, chills, nausea, vomiting, diarrhea, cough or shortness of breath.  There are no other known modifying factors.    Past Medical History:  Diagnosis Date  . Asthma   . COVID-19 virus infection 10/2020  . Morbid obesity (HCC)   . Shrimp allergy     Patient Active Problem List   Diagnosis Date Noted  . Asthma   . Morbid obesity (HCC)   . COVID-19 virus infection 10/2020  . Severe persistent asthma with exacerbation 09/17/2019  . Allergic rhinitis 09/17/2019    Past Surgical History:  Procedure Laterality Date  . CESAREAN SECTION       OB History   No obstetric history on file.     Family History  Problem Relation Age of Onset  . Asthma Mother   . Diabetes Father     Social History   Tobacco Use  . Smoking status: Never Smoker  . Smokeless tobacco: Never Used  Vaping Use  . Vaping Use: Never used  Substance Use Topics  . Alcohol use: No  . Drug use: No    Home Medications Prior to Admission medications   Medication Sig Start Date End Date Taking? Authorizing Provider  albuterol (PROVENTIL) (2.5 MG/3ML) 0.083% nebulizer solution USE 1 VIAL IN NEBULIZER EVERY 6 HOURS AS NEEDED FOR WHEEZING OR SHORTNESS OF BREATH 08/18/20   Storm Frisk, MD  albuterol (VENTOLIN HFA) 108 (90 Base) MCG/ACT inhaler Inhale 2 puffs into the lungs every 4 (four) hours as needed for wheezing or shortness of  breath. 09/13/19   Ward, Layla Maw, DO  albuterol (VENTOLIN HFA) 108 (90 Base) MCG/ACT inhaler Inhale 2 puffs into the lungs every 4 (four) hours as needed for wheezing or shortness of breath. 08/22/20   Ward, Layla Maw, DO  azelastine (ASTELIN) 0.1 % nasal spray Place 2 sprays into both nostrils 2 (two) times daily. Use in each nostril as directed 09/17/19   Storm Frisk, MD  fluticasone [facility]) 50 MCG/ACT nasal spray Place 2 sprays into both nostrils daily. 09/17/19   Storm Frisk, MD  mometasone-formoterol (DULERA) 200-5 MCG/ACT AERO Inhale 2 puffs into the lungs 2 (two) times daily. 09/17/19   Storm Frisk, MD  Nebulizer System All-In-One MISC 1 Dose by Does not apply route 4 (four) times daily as needed (shortness of breath). Use with albuterol 10/17/19   Storm Frisk, MD  predniSONE (DELTASONE) 20 MG tablet Take 3 tablets (60 mg total) by mouth daily. 08/22/20   Ward, Layla Maw, DO  sodium chloride (OCEAN) 0.65 % SOLN nasal spray Place 1 spray into both nostrils as needed for congestion. 09/17/19   Storm Frisk, MD  Spacer/Aero-Holding Chambers (AEROCHAMBER MV) inhaler Use as instructed 10/17/19   Storm Frisk, MD  triamcinolone cream (KENALOG) 0.1 % Apply 1 application topically 2 (two) times daily. 10/18/19  Storm Frisk, MD  cetirizine (ZYRTEC) 10 MG tablet Take 1 tablet (10 mg total) by mouth daily. Patient not taking: Reported on 08/01/2019 08/27/18 09/13/19  Georgetta Haber, NP    Allergies    Nsaids, Shellfish allergy, and Oxycodone-acetaminophen  Review of Systems   Review of Systems  All other systems reviewed and are negative.   Physical Exam Updated Vital Signs BP (!) 145/75 (BP Location: Left Arm)   Pulse 89   Temp 98.9 F (37.2 C) (Oral)   Resp 18   SpO2 96%   Physical Exam Vitals and nursing note reviewed.  Constitutional:      General: She is not in acute distress.    Appearance: She is well-developed and well-nourished. She is obese.  She is not ill-appearing, toxic-appearing or diaphoretic.  HENT:     Head: Normocephalic and atraumatic.     Right Ear: External ear normal.     Left Ear: External ear normal.  Eyes:     Extraocular Movements: EOM normal.     Conjunctiva/sclera: Conjunctivae normal.     Pupils: Pupils are equal, round, and reactive to light.  Neck:     Trachea: Phonation normal.  Cardiovascular:     Rate and Rhythm: Normal rate and regular rhythm.  Pulmonary:     Effort: Pulmonary effort is normal. No respiratory distress.     Breath sounds: No stridor.  Chest:     Chest wall: No bony tenderness.  Abdominal:     General: There is no distension.  Musculoskeletal:        General: Normal range of motion.     Cervical back: Normal range of motion and neck supple.  Skin:    General: Skin is warm, dry and intact.  Neurological:     Mental Status: She is alert and oriented to person, place, and time.     Cranial Nerves: No cranial nerve deficit.     Sensory: No sensory deficit.     Motor: No abnormal muscle tone.     Coordination: Coordination normal.  Psychiatric:        Mood and Affect: Mood and affect and mood normal.        Behavior: Behavior normal.        Thought Content: Thought content normal.        Judgment: Judgment normal.     ED Results / Procedures / Treatments   Labs (all labs ordered are listed, but only abnormal results are displayed) Labs Reviewed - No data to display  EKG None  Radiology No results found.  Procedures Procedures (including critical care time)  Medications Ordered in ED Medications - No data to display  ED Course  I have reviewed the triage vital signs and the nursing notes.  Pertinent labs & imaging results that were available during my care of the patient were reviewed by me and considered in my medical decision making (see chart for details).    MDM Rules/Calculators/A&P                           Patient Vitals for the past 24 hrs:  BP  Temp Temp src Pulse Resp SpO2  10/21/20 0739 (!) 145/75 98.9 F (37.2 C) Oral 89 18 96 %    11:01 AM Reevaluation with update and discussion. After initial assessment and treatment, an updated evaluation reveals no change in clinical status, findings discussed with the patient, all questions answered. Mechele Collin  [facility]   Medical Decision Making:  This patient is presenting for evaluation of weakness and COVID infection, which does not require a range of treatment options, and is not a complaint that involves a high risk of morbidity and mortality. The differential diagnoses include COVID infection, flu infection, malaise. I decided to review old records, and in summary middle-aged female presenting with nonspecific symptoms, diagnosed with COVID infection, 10 days ago.  I did not require additional historical information from anyone.   Critical Interventions-clinical evaluation, discussion with  After These Interventions, the Patient was reevaluated and was found stable for discharge.  Patient with nonspecific symptoms, and reassuring evaluation.  No indication for hospitalization or ED work-up at this time.  CRITICAL CARE-no Performed by: Mancel Bale  Nursing Notes Reviewed/ Care Coordinated Applicable Imaging Reviewed Interpretation of Laboratory Data incorporated into ED treatment  The patient appears reasonably screened and/or stabilized for discharge and I doubt any other medical condition or other [facility] requiring further screening, evaluation, or treatment in the ED at this time prior to discharge.  Plan: Home Medications-continue usual and use symptomatic treatment of choice; Home Treatments-rest, fluids; return here if the recommended treatment, does not improve the symptoms; Recommended follow up-PCP, PRN     Final Clinical Impression(s) / ED Diagnoses Final diagnoses:  None    Rx / DC Orders ED Discharge Orders    None       Mancel Bale, MD 10/21/20 1103

## 2020-10-21 NOTE — Discharge Instructions (Signed)
It appears that you still have symptoms of COVID-19 infection.  You may or may not be infectious at this time, so we are going to keep you out of work till next week.  Hopefully in the next few days you will start to feel stronger and have less symptoms.  Try to drink plenty of fluids, and eat three meals each day.  Follow-up with your doctor if needed for ongoing problems.

## 2022-05-20 ENCOUNTER — Other Ambulatory Visit: Payer: Self-pay

## 2022-05-20 ENCOUNTER — Encounter (HOSPITAL_COMMUNITY): Payer: Self-pay | Admitting: Emergency Medicine

## 2022-05-20 ENCOUNTER — Emergency Department (HOSPITAL_COMMUNITY)
Admission: EM | Admit: 2022-05-20 | Discharge: 2022-05-21 | Discharge status: Against Medical Advice | Payer: Self-pay | Attending: Emergency Medicine | Admitting: Emergency Medicine

## 2022-05-20 DIAGNOSIS — Z5321 Procedure and treatment not carried out due to patient leaving prior to being seen by health care provider: Secondary | ICD-10-CM | POA: Insufficient documentation

## 2022-05-20 DIAGNOSIS — M545 Low back pain, unspecified: Secondary | ICD-10-CM | POA: Insufficient documentation

## 2022-05-20 DIAGNOSIS — R309 Painful micturition, unspecified: Secondary | ICD-10-CM | POA: Insufficient documentation

## 2022-05-20 LAB — URINALYSIS, ROUTINE W REFLEX MICROSCOPIC
Bilirubin Urine: NEGATIVE
Glucose, UA: NEGATIVE mg/dL
Ketones, ur: NEGATIVE mg/dL
Leukocytes,Ua: NEGATIVE
Nitrite: NEGATIVE
Protein, ur: NEGATIVE mg/dL
Specific Gravity, Urine: 1.021 (ref 1.005–1.030)
pH: 5 (ref 5.0–8.0)

## 2022-05-20 NOTE — ED Provider Triage Note (Signed)
Emergency Medicine Provider Triage Evaluation Note  Debbie Vaughn , a 56 y.o. female  was evaluated in triage.  Pt complains of lower back and left leg pain x 1 week. Tried tylenol last night without relief. [place] Spaniel it feels like when she had a kidney infection in the past. Pain worse with movement. Works at [employer], unsure if she did something to hurt her back.   Review of Systems  Positive: Low back pain, urinary discomfort Negative: Hematuria, numbness, fever  Physical Exam  BP (!) 159/88 (BP Location: Left Arm)   Pulse 93   Temp 98.2 F (36.8 C) (Oral)   Resp 16   SpO2 98%  Gen:   Awake, no distress   Resp:  Normal effort  MSK:   Moves extremities without difficulty  Other:    Medical Decision Making  Medically screening exam initiated at 9:55 PM.  Appropriate orders placed.  Debbie Vaughn was informed that the remainder of the evaluation will be completed by another provider, this initial triage assessment does not replace that evaluation, and the importance of remaining in the ED until their evaluation is complete.     Debbie Vaughn, Debbie Vaughn 05/20/22 2157

## 2022-05-20 NOTE — ED Triage Notes (Signed)
Patient reports left lower back pain onset last week radiating to left lower leg , denies injury , pain increases with movement / changing positions .

## 2022-05-21 NOTE — ED Notes (Signed)
Patient states the wait is too long and she is leaving 

## 2023-10-03 ENCOUNTER — Other Ambulatory Visit: Payer: Self-pay

## 2023-10-03 ENCOUNTER — Encounter (HOSPITAL_COMMUNITY): Payer: Self-pay

## 2023-10-03 ENCOUNTER — Emergency Department (HOSPITAL_COMMUNITY): Payer: BLUE CROSS/BLUE SHIELD

## 2023-10-03 ENCOUNTER — Emergency Department (HOSPITAL_COMMUNITY)
Admission: EM | Admit: 2023-10-03 | Discharge: 2023-10-04 | Disposition: A | Discharge status: Home / Self Care | Payer: BLUE CROSS/BLUE SHIELD | Attending: Emergency Medicine | Admitting: Emergency Medicine

## 2023-10-03 DIAGNOSIS — Z7951 Long term (current) use of inhaled steroids: Secondary | ICD-10-CM | POA: Diagnosis not present

## 2023-10-03 DIAGNOSIS — M79675 Pain in left toe(s): Secondary | ICD-10-CM | POA: Diagnosis present

## 2023-10-03 DIAGNOSIS — J45909 Unspecified asthma, uncomplicated: Secondary | ICD-10-CM | POA: Insufficient documentation

## 2023-10-03 NOTE — ED Triage Notes (Signed)
Pt arrived from home via POV c/o left pinky toe pain. 7/10 burning. Pt states that it has been ongoing for weeks and just is not getting any better. Zero noted redness to toe, zero noted open areas. Pt denies injury to the area.

## 2023-10-04 NOTE — Discharge Instructions (Signed)
You were seen in the urgency department for toe pain.  As we discussed your x-ray did not show any broken bones.  This does not appear to be infected.  I recommend taking ibuprofen or Tylenol as needed for pain.  I recommend using some blister bandages over the area to provide some cushion.  Please follow up with your primary care provider regarding your visit today. If you do not have a primary care provider, you may reach out to [facility] at [phone] to establish with one and make your first appointment.  Continue to monitor how you're doing and return to the ER for new or worsening symptoms such as redness, worsening pain, or if you develop a fever.

## 2023-10-04 NOTE — ED Provider Notes (Signed)
[place] EMERGENCY DEPARTMENT AT [facility] Provider Note   CSN: [id number] Arrival date & time: 10/03/23  1952     History  Chief Complaint  Patient presents with   Toe Pain    Debbie Vaughn is a 57 y.o. female who presents the emergency department complaining of pain in her left pinky toe.  Describes it as a burning pain.  States has been going on for several weeks, not getting any better.  She is not sure if it could have been rubbing in her shoes.  Has some intermittent numbness in the lower extremities, no different today.  Is not sure if her toenail could be ingrown, has never had this before.  Denies any known injury to the area.   Toe Pain       Home Medications Prior to Admission medications   Medication Sig Start Date End Date Taking? Authorizing Provider  albuterol (PROVENTIL) (2.5 MG/3ML) 0.083% nebulizer solution USE 1 VIAL IN NEBULIZER EVERY 6 HOURS AS NEEDED FOR WHEEZING OR SHORTNESS OF BREATH 08/18/20   Storm Frisk, MD  albuterol (VENTOLIN HFA) 108 (90 Base) MCG/ACT inhaler Inhale 2 puffs into the lungs every 4 (four) hours as needed for wheezing or shortness of breath. 09/13/19   Ward, Layla Maw, DO  albuterol (VENTOLIN HFA) 108 (90 Base) MCG/ACT inhaler Inhale 2 puffs into the lungs every 4 (four) hours as needed for wheezing or shortness of breath. 08/22/20   Ward, Layla Maw, DO  azelastine (ASTELIN) 0.1 % nasal spray Place 2 sprays into both nostrils 2 (two) times daily. Use in each nostril as directed 09/17/19   Storm Frisk, MD  fluticasone [facility]) 50 MCG/ACT nasal spray Place 2 sprays into both nostrils daily. 09/17/19   Storm Frisk, MD  mometasone-formoterol (DULERA) 200-5 MCG/ACT AERO Inhale 2 puffs into the lungs 2 (two) times daily. 09/17/19   Storm Frisk, MD  Nebulizer System All-In-One MISC 1 Dose by Does not apply route 4 (four) times daily as needed (shortness of breath). Use with albuterol 10/17/19   Storm Frisk,  MD  predniSONE (DELTASONE) 20 MG tablet Take 3 tablets (60 mg total) by mouth daily. 08/22/20   Ward, Layla Maw, DO  sodium chloride (OCEAN) 0.65 % SOLN nasal spray Place 1 spray into both nostrils as needed for congestion. 09/17/19   Storm Frisk, MD  Spacer/Aero-Holding Chambers (AEROCHAMBER MV) inhaler Use as instructed 10/17/19   Storm Frisk, MD  triamcinolone cream (KENALOG) 0.1 % Apply 1 application topically 2 (two) times daily. 10/18/19   Storm Frisk, MD  cetirizine (ZYRTEC) 10 MG tablet Take 1 tablet (10 mg total) by mouth daily. Patient not taking: Reported on 08/01/2019 08/27/18 09/13/19  Georgetta Haber, NP      Allergies    Nsaids, Shellfish allergy, and Oxycodone-acetaminophen    Review of Systems   Review of Systems  Musculoskeletal:        Left pinky toe pain  All other systems reviewed and are negative.   Physical Exam Updated Vital Signs BP (!) 152/78 (BP Location: Left Arm)   Pulse 79   Temp 97.9 F (36.6 C) (Oral)   Resp 16   Ht 5\' 5"  (1.651 m)   Wt 113.4 kg   LMP 11/19/2012   SpO2 100%   BMI 41.60 kg/m  Physical Exam Vitals and nursing note reviewed.  Constitutional:      Appearance: Normal appearance.  HENT:     Head:  Normocephalic and atraumatic.  Eyes:     Conjunctiva/sclera: Conjunctivae normal.  Pulmonary:     Effort: Pulmonary effort is normal. No respiratory distress.  Musculoskeletal:     Comments: Compartments of the lower extremities are soft, no overlying skin changes  Feet:     Left foot:     Toenail Condition: Left toenails are normal.     Comments: Left little toe without significant tenderness, callus noted laterally, no other skin changes, normal sensation and capillary refill Skin:    General: Skin is warm and dry.  Neurological:     Mental Status: She is alert.  Psychiatric:        Mood and Affect: Mood normal.        Behavior: Behavior normal.     ED Results / Procedures / Treatments   Labs (all labs  ordered are listed, but only abnormal results are displayed) Labs Reviewed - No data to display  EKG None  Radiology DG Foot Complete Left Result Date: 10/03/2023 CLINICAL DATA:  Wound, pain, swelling. EXAM: LEFT FOOT - COMPLETE 3 VIEW COMPARISON:  None Available. FINDINGS: Pes planus. Posterior and plantar calcaneal spurs. No acute fracture, dislocation or subluxation. No osteolytic or osteoblastic changes. Small focal discontinuity noted in the distal phalanx of the great toe. This could indicate osteomyelitis, and if this is a concern clinically, consider three-phase bone scan or MRI further evaluation. IMPRESSION: Distal phalanx great toe cortical discontinuity. Consider further evaluation as above if there is concern for osteomyelitis. Electronically Signed   By: Layla Maw M.D.   On: 10/03/2023 22:10    Procedures Procedures    Medications Ordered in ED Medications - No data to display  ED Course/ Medical Decision Making/ A&P                                 Medical Decision Making Amount and/or Complexity of Data Reviewed Radiology: ordered.   This patient is a 57 y.o. female  who presents to the ED for concern of left pinky toe pain x weeks.   Differential diagnoses prior to evaluation: The emergent differential diagnosis includes, but is not limited to,  paronychia, ingrown toenail, felon, dactylitis, gout, osteomyelitis  . This is not an exhaustive differential.   Past Medical History / Co-morbidities / Social History: asthma  Physical Exam: Physical exam performed. The pertinent findings include: Callus noted to the lateral left little toe, no other skin changes.  Normal sensation and capillary refill.  Compartments of the lower extremity are soft.  Lab Tests/Imaging studies: I personally interpreted labs/imaging and the pertinent results include: X-ray showed some distal phalanx great toe cortical discontinuity, however clinically there is no concern for  osteomyelitis, and the little toe is unremarkable.  I agree with the radiologist interpretation.  Disposition: After consideration of the diagnostic results and the patients response to treatment, I feel that emergency department workup does not suggest an emergent condition requiring admission or immediate intervention beyond what has been performed at this time. The plan is: Discharge to home.  Suspect pain likely in the setting of callus/irritation with shoes.  Recommended OTC meds and footcare.  Recommended follow-up with PCP, given contact information for [facility].. The patient is safe for discharge and has been instructed to return immediately for worsening symptoms, change in symptoms or any other concerns.  Final Clinical Impression(s) / ED Diagnoses Final diagnoses:  Pain of  toe of left foot    Rx / DC Orders ED Discharge Orders     None      Portions of this report may have been transcribed using voice recognition software. Every effort was made to ensure accuracy; however, inadvertent computerized transcription errors may be present.    Su Monks, PA-C 10/04/23 0200    Gilda Crease, MD 10/04/23 [phone]

## 2024-02-19 ENCOUNTER — Other Ambulatory Visit: Payer: Self-pay

## 2024-02-19 ENCOUNTER — Emergency Department (HOSPITAL_COMMUNITY)
Admission: EM | Admit: 2024-02-19 | Discharge: 2024-02-20 | Disposition: A | Discharge status: Home / Self Care | Attending: Emergency Medicine | Admitting: Emergency Medicine

## 2024-02-19 ENCOUNTER — Emergency Department (HOSPITAL_COMMUNITY)

## 2024-02-19 DIAGNOSIS — R000 Tachycardia, unspecified: Secondary | ICD-10-CM | POA: Diagnosis not present

## 2024-02-19 DIAGNOSIS — R062 Wheezing: Secondary | ICD-10-CM | POA: Diagnosis not present

## 2024-02-19 DIAGNOSIS — R079 Chest pain, unspecified: Secondary | ICD-10-CM

## 2024-02-19 DIAGNOSIS — R0781 Pleurodynia: Secondary | ICD-10-CM | POA: Diagnosis not present

## 2024-02-19 DIAGNOSIS — L03116 Cellulitis of left lower limb: Secondary | ICD-10-CM

## 2024-02-19 DIAGNOSIS — Z8616 Personal history of COVID-19: Secondary | ICD-10-CM | POA: Diagnosis not present

## 2024-02-19 DIAGNOSIS — R600 Localized edema: Secondary | ICD-10-CM | POA: Diagnosis not present

## 2024-02-19 DIAGNOSIS — M79662 Pain in left lower leg: Secondary | ICD-10-CM | POA: Diagnosis not present

## 2024-02-19 LAB — RESP PANEL BY RT-PCR (RSV, FLU A&B, COVID)  RVPGX2
Influenza A by PCR: NEGATIVE
Influenza B by PCR: NEGATIVE
Resp Syncytial Virus by PCR: NEGATIVE
SARS Coronavirus 2 by RT PCR: NEGATIVE

## 2024-02-19 LAB — CBC
HCT: 39.2 % (ref 36.0–46.0)
Hemoglobin: 12.2 g/dL (ref 12.0–15.0)
MCH: 28.1 pg (ref 26.0–34.0)
MCHC: 31.1 g/dL (ref 30.0–36.0)
MCV: 90.3 fL (ref 80.0–100.0)
Platelets: 237 10*3/uL (ref 150–400)
RBC: 4.34 MIL/uL (ref 3.87–5.11)
RDW: 14.6 % (ref 11.5–15.5)
WBC: 8.8 10*3/uL (ref 4.0–10.5)
nRBC: 0 % (ref 0.0–0.2)

## 2024-02-19 LAB — BASIC METABOLIC PANEL WITH GFR
Anion gap: 12 (ref 5–15)
BUN: 10 mg/dL (ref 6–20)
CO2: 27 mmol/L (ref 22–32)
Calcium: 9.2 mg/dL (ref 8.9–10.3)
Chloride: 99 mmol/L (ref 98–111)
Creatinine, Ser: 0.78 mg/dL (ref 0.44–1.00)
GFR, Estimated: >60 mL/min (ref >60)
Glucose, Bld: 117 mg/dL — ABNORMAL HIGH (ref 70–99)
Potassium: 4 mmol/L (ref 3.5–5.1)
Sodium: 138 mmol/L (ref 135–145)

## 2024-02-19 LAB — POC URINE PREG, ED: Preg Test, Ur: NEGATIVE

## 2024-02-19 LAB — TROPONIN I (HIGH SENSITIVITY)
Troponin I (High Sensitivity): 6 ng/L (ref <18)
Troponin I (High Sensitivity): 6 ng/L (ref <18)

## 2024-02-19 MED ORDER — ACETAMINOPHEN 500 MG PO TABS
1000.0000 mg | ORAL_TABLET | Freq: Once | ORAL | Status: AC
Start: 2024-02-19 — End: 2024-02-19
  Administered 2024-02-19: 1000 mg via ORAL
  Filled 2024-02-19: qty 2

## 2024-02-19 MED ORDER — ALBUTEROL SULFATE HFA 108 (90 BASE) MCG/ACT IN AERS
4.0000 | INHALATION_SPRAY | RESPIRATORY_TRACT | Status: DC | PRN
  Administered 2024-02-19 – 2024-02-20 (×2): 4 via RESPIRATORY_TRACT
  Filled 2024-02-19: qty 6.7

## 2024-02-19 NOTE — ED Triage Notes (Signed)
 The pt is c/o chest pain since yesterday  and she reports that she has a knot in her abd she wants checked   some intermittent dizziness also

## 2024-02-19 NOTE — ED Provider Notes (Signed)
 [facility] EMERGENCY DEPARTMENT AT [facility] Provider Note  History  Chief Complaint:  Chest Pain   Chest Pain    Debbie Vaughn is a 58 y.o. female with a history of obesity, asthma who presents the emergency department for shortness of breath and chest pain.  She reports that over the past week she has noticed that her left leg has been swollen more than normal.  She states that she does have bilateral leg swelling at baseline.  She states that she has had cellulitis in that lower extremity and she feels this is the reason why it swelling worse.  She states she is on no medications at home.  She states that this morning when she woke up she noticed that she was having chest pain and difficulty breathing.  She states that it does hurt when she takes a big deep breath.  She has never had a DVT or PE before.  She is on no blood thinners.  No travel.  Past Medical History:  Diagnosis Date   Asthma    COVID-19 virus infection 10/2020   Morbid obesity (HCC)    Shrimp allergy     Past Surgical History:  Procedure Laterality Date   CESAREAN SECTION      Family History  Problem Relation Age of Onset   Asthma Mother    Diabetes Father     Social History   Tobacco Use   Smoking status: Never   Smokeless tobacco: Never  Vaping Use   Vaping status: Never Used  Substance Use Topics   Alcohol use: No   Drug use: No    Review of Systems  Review of Systems  Cardiovascular:  Positive for chest pain.     Reviewed and documented in HPI if pertinent.   Physical Exam   ED Triage Vitals  Encounter Vitals Group     BP 02/19/24 1831 (!) 184/92     Systolic BP Percentile --      Diastolic BP Percentile --      Pulse Rate 02/19/24 1831 (!) 110     Resp 02/19/24 1831 20     Temp 02/19/24 1831 (!) 101.7 F (38.7 C)     Temp Source 02/19/24 1946 Oral     SpO2 02/19/24 1831 96 %     Weight 02/19/24 1845 250 lb (113.4 kg)     Height 02/19/24 1845 5\' 5"  (1.651 m)      Head Circumference --      Peak Flow --      Pain Score 02/19/24 1845 5     Pain Loc --      Pain Education --      Exclude from Growth Chart --      Physical Exam Vitals and nursing note reviewed.  Constitutional:      General: She is not in acute distress.    Appearance: She is well-developed.  HENT:     Head: Normocephalic and atraumatic.  Eyes:     Conjunctiva/sclera: Conjunctivae normal.  Cardiovascular:     Rate and Rhythm: Regular rhythm. Tachycardia present.     Heart sounds: No murmur heard. Pulmonary:     Effort: Pulmonary effort is normal. No respiratory distress.     Breath sounds: Examination of the right-lower field reveals wheezing. Examination of the left-lower field reveals wheezing. Wheezing present.  Abdominal:     Palpations: Abdomen is soft.     Tenderness: There is no abdominal tenderness.  Musculoskeletal:  General: No swelling.     Cervical back: Neck supple.     Right lower leg: 1+ Edema present.     Left lower leg: 3+ Edema present.  Skin:    General: Skin is warm and dry.     Capillary Refill: Capillary refill takes less than 2 seconds.  Neurological:     Mental Status: She is alert.  Psychiatric:        Mood and Affect: Mood normal.      Procedures   Procedures  ED Course - Medical Decision Making  Brief Overview Debbie Vaughn is a 58 y.o. female who presents as per above.  I have reviewed the nursing documentation for past medical history, family history, and social history and agree.  I have reviewed the patient's vital signs. There are ***no abnormalities***.  Initial Differential Diagnoses: I am primarily concerned for ***  Therapies: These medications and interventions were provided for the patient while in the ED.  Medications - No data to display  Testing Results: On my interpretation labs are significant for : ***  On my interpretation imaging is significant for: ***  ***I interpreted the ECG. It reveals a  sinus rhythm. The QTc, PR, and QRS are appropriate. There are no signs of acute ischemia or of significant electrical abnormalities. The ECG does not show a STEMI. There are no ST depressions. ***There are no T wave inversions. There is no evidence of a High-Grade Conduction Block, WPW, Brugada Sign, ARVC, DeWinters T Waves, or Wellens Waves.  See the EMR for full details regarding lab and imaging results.  ***  Medical Decision Making    ### All radiography studies, electrocardiograms, and laboratory data were personally reviewed by me and incorporated into my medical decision making. Impression  No diagnosis found.   Note: Chief Executive Officer was used in the creation of this note.

## 2024-02-20 ENCOUNTER — Emergency Department (HOSPITAL_COMMUNITY)

## 2024-02-20 DIAGNOSIS — M79662 Pain in left lower leg: Secondary | ICD-10-CM

## 2024-02-20 LAB — D-DIMER, QUANTITATIVE: D-Dimer, Quant: 0.65 ug{FEU}/mL — ABNORMAL HIGH (ref 0.00–0.50)

## 2024-02-20 MED ORDER — CEPHALEXIN 250 MG PO CAPS
500.0000 mg | ORAL_CAPSULE | Freq: Once | ORAL | Status: AC
  Administered 2024-02-20: 500 mg via ORAL
  Filled 2024-02-20: qty 2

## 2024-02-20 MED ORDER — DIPHENHYDRAMINE HCL 50 MG/ML IJ SOLN
50.0000 mg | Freq: Once | INTRAMUSCULAR | Status: AC
  Administered 2024-02-20: 50 mg via INTRAVENOUS
  Filled 2024-02-20: qty 1

## 2024-02-20 MED ORDER — IOHEXOL 350 MG/ML SOLN
75.0000 mL | Freq: Once | INTRAVENOUS | Status: AC | PRN
  Administered 2024-02-20: 75 mL via INTRAVENOUS

## 2024-02-20 MED ORDER — METHYLPREDNISOLONE SODIUM SUCC 125 MG IJ SOLR
125.0000 mg | INTRAMUSCULAR | Status: AC
  Administered 2024-02-20: 125 mg via INTRAVENOUS
  Filled 2024-02-20: qty 2

## 2024-02-20 MED ORDER — DIPHENHYDRAMINE HCL 50 MG/ML IJ SOLN: 50.0000 mg | Freq: Once | INTRAMUSCULAR | Status: DC

## 2024-02-20 MED ORDER — CEPHALEXIN 500 MG PO CAPS
500.0000 mg | ORAL_CAPSULE | Freq: Four times a day (QID) | ORAL | 0 refills | Status: AC
Start: 2024-02-20 — End: ?

## 2024-02-20 NOTE — Progress Notes (Signed)
 Left lower extremity venous duplex has been completed. Preliminary results can be found in CV Proc through chart review.  Results were given to Dr. Randal Bury.  02/20/24 9:26 AM Birda Buffy RVT

## 2024-02-20 NOTE — Discharge Instructions (Signed)
 Return if any problems.

## 2024-02-20 NOTE — ED Provider Notes (Signed)
  Physical Exam  BP (!) 145/79   Pulse 80   Temp 98.1 F (36.7 C) (Oral)   Resp 15   Ht 5\' 5"  (1.651 m)   Wt 113.4 kg   LMP 11/19/2012   SpO2 98%   BMI 41.60 kg/m   Physical Exam  Procedures  Procedures  ED Course / MDM   Clinical Course as of 02/20/24 0629  Mon Feb 20, 2024  0017 [id number] with pleuritic chest pain, obese, hx wheezing. LLE with 3+ swelling, RLE 1+. No SOB, more pleuritic chest pain. Negative trops. CTA negative, start her on empiric abx for cellulitis, heparin injection, dvt study in am of LLE.  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 She reports it would make her short of breath [CG]    Clinical Course User Index [CG] Adel Aden, PA-C   Medical Decision Making Amount and/or Complexity of Data Reviewed Labs: ordered. Radiology: ordered.  Risk OTC drugs. Prescription drug management.   58 year old female signed out to me at shift change pending imaging.  Please see the previous provider note for further details.  In short, 58 year old female presenting with pleuritic chest pain.  Patient with left lower extremity edema with 3+ swelling, right lower extremity edema with 1+ swelling.  No shortness of breath, more pleuritic chest pain in nature.  Patient has a negative troponins here.  Patient was signed out to me pending CTA.  Patient apparently did not have IV access so this is why CTA was delayed.  Patient did have D-dimer drawn however it is elevated at 0.67.  Patient states that she had an allergy to iodine, she is unsure of her reaction.  She was premedicated with Solu-Medrol  and Benadryl  following emergent CTA protocol.  Patient CTA has not resulted.  Unfortunately, we will have to sign patient out to oncoming provider.  Will also order ultrasound imaging of left lower extremity to rule out DVT.  Patient provided with 500 mg Keflex to cover for cellulitis  per the previous provider plan.  Signed out to oncoming provider Marcia Setters, PA-C.  Plan of management discussed.        Adel Aden, PA-C 02/20/24 8119    Long, Joshua G, MD 02/21/24 [phone]

## 2024-02-20 NOTE — ED Provider Notes (Signed)
 Patient's care assumed at 6:30 AM.  Patient is pending ultrasound of her left lower extremity.  Ultrasound shows no evidence of DVT.  Patient reexamined.  She reports swelling has gone down some with elevation.  Patient is given a prescription for Keflex that she is advised to watch for increasing redness.  She may have an early cellulitis.  Patient is advised to elevate her leg.  Follow-up with her primary care physician for recheck return to the emergency department if symptoms worsen or change.   Haruto Demaria K, PA-C 02/20/24 1001    Tonya Fredrickson, MD 02/20/24 [phone]

## 2024-02-21 ENCOUNTER — Emergency Department (HOSPITAL_COMMUNITY)
Admission: EM | Admit: 2024-02-21 | Discharge: 2024-02-22 | Disposition: A | Discharge status: Home / Self Care | Attending: Emergency Medicine | Admitting: Emergency Medicine

## 2024-02-21 ENCOUNTER — Emergency Department (HOSPITAL_COMMUNITY)

## 2024-02-21 ENCOUNTER — Other Ambulatory Visit: Payer: Self-pay

## 2024-02-21 ENCOUNTER — Encounter (HOSPITAL_COMMUNITY): Payer: Self-pay | Admitting: Pharmacy Technician

## 2024-02-21 DIAGNOSIS — Z8616 Personal history of COVID-19: Secondary | ICD-10-CM | POA: Diagnosis not present

## 2024-02-21 DIAGNOSIS — J4521 Mild intermittent asthma with (acute) exacerbation: Secondary | ICD-10-CM | POA: Insufficient documentation

## 2024-02-21 DIAGNOSIS — R0602 Shortness of breath: Secondary | ICD-10-CM | POA: Diagnosis present

## 2024-02-21 NOTE — ED Triage Notes (Signed)
 Pt here with reports of shob all day today. States she was given contrast yesterday and reports she is allergic. States she was given steroids and benadryl  prior to scan, but believes the shob is related to the iodine. Pt speaking in complete sentences, NAD noted.

## 2024-02-22 MED ORDER — IPRATROPIUM-ALBUTEROL 0.5-2.5 (3) MG/3ML IN SOLN
3.0000 mL | Freq: Once | RESPIRATORY_TRACT | Status: AC
  Administered 2024-02-22: 3 mL via RESPIRATORY_TRACT
  Filled 2024-02-22: qty 3

## 2024-02-22 MED ORDER — DEXAMETHASONE SODIUM PHOSPHATE 10 MG/ML IJ SOLN
10.0000 mg | Freq: Once | INTRAMUSCULAR | Status: AC
  Administered 2024-02-22: 10 mg via INTRAVENOUS
  Filled 2024-02-22: qty 1

## 2024-02-22 NOTE — ED Notes (Signed)
 Patient is resting comfortably.

## 2024-02-22 NOTE — Discharge Instructions (Signed)
 You were seen today for shortness of breath.  This is likely related to your asthma.  Continue nebs or albuterol  at home as needed.  You were given a dose of long-acting steroid in the emergency department.  If you have any new or worsening symptoms, you should be reevaluated.

## 2024-02-22 NOTE — ED Provider Notes (Signed)
 [place] EMERGENCY DEPARTMENT AT [facility] Provider Note   CSN: [id number] Arrival date & time: 02/21/24  1833     History  Chief Complaint  Patient presents with   Shortness of Breath    Ghalia Bibbs is a 58 y.o. female.  HPI     This is a 58 year old female who presents for shortness of breath.  Patient reports that she was seen and evaluated yesterday.  She had a CT scan with contrast.  She was premedicated because of a contrast allergy.  She states that since going home she has had shortness of breath.  She used her inhaler with minimal relief.  She believes she may be having allergic reaction.  She does have a history of asthma as well.  No recent fevers or cough.  Denies chest pain, nausea, vomiting, rash.  Chart review reveals that she had a CT scan done on 5/12 at around 6:00 in the morning.  This is approximately 36 hours prior to evaluation today.  Home Medications Prior to Admission medications   Medication Sig Start Date End Date Taking? Authorizing Provider  albuterol  (PROVENTIL ) (2.5 MG/3ML) 0.083% nebulizer solution USE 1 VIAL IN NEBULIZER EVERY 6 HOURS AS NEEDED FOR WHEEZING OR SHORTNESS OF BREATH Patient not taking: Reported on 02/20/2024 08/18/20   Vernell Goldsmith, MD  albuterol  (VENTOLIN  HFA) 108 [phone] Base) MCG/ACT inhaler Inhale 2 puffs into the lungs every 4 (four) hours as needed for wheezing or shortness of breath. 08/22/20   Ward, Clover Dao, DO  cephALEXin (KEFLEX) 500 MG capsule Take 1 capsule (500 mg total) by mouth 4 (four) times daily. 02/20/24   Sofia, Leslie K, PA-C  mometasone -formoterol  (DULERA ) 200-5 MCG/ACT AERO Inhale 2 puffs into the lungs 2 (two) times daily. Patient not taking: Reported on 02/20/2024 09/17/19   Vernell Goldsmith, MD  Nebulizer System All-In-One MISC 1 Dose by Does not apply route 4 (four) times daily as needed (shortness of breath). Use with albuterol  Patient not taking: Reported on 02/20/2024 10/17/19   Vernell Goldsmith, MD  cetirizine  (ZYRTEC ) 10 MG tablet Take 1 tablet (10 mg total) by mouth daily. Patient not taking: Reported on 08/01/2019 08/27/18 09/13/19  Burky, Natalie B, NP      Allergies    Corn-containing products, Iodine, Nsaids, Shellfish allergy, and Oxycodone-acetaminophen    Review of Systems   Review of Systems  Constitutional:  Negative for fever.  Respiratory:  Positive for shortness of breath and wheezing. Negative for cough.   Cardiovascular:  Negative for chest pain.  All other systems reviewed and are negative.   Physical Exam Updated Vital Signs BP (!) 161/89   Pulse 86   Temp 98.1 F (36.7 C)   Resp 20   LMP 11/19/2012   SpO2 98%  Physical Exam Vitals and nursing note reviewed.  Constitutional:      Appearance: She is well-developed. She is obese. She is not ill-appearing.  HENT:     Head: Normocephalic and atraumatic.  Eyes:     Pupils: Pupils are equal, round, and reactive to light.  Cardiovascular:     Rate and Rhythm: Normal rate and regular rhythm.     Heart sounds: Normal heart sounds.  Pulmonary:     Effort: Pulmonary effort is normal. No respiratory distress.     Breath sounds: Wheezing present.     Comments: Fair air movement, no respiratory distress, wheezing in all lung fields Abdominal:     General: Bowel sounds are  normal.     Palpations: Abdomen is soft.  Musculoskeletal:     Cervical back: Neck supple.  Skin:    General: Skin is warm and dry.  Neurological:     Mental Status: She is alert and oriented to person, place, and time.  Psychiatric:        Mood and Affect: Mood normal.     ED Results / Procedures / Treatments   Labs (all labs ordered are listed, but only abnormal results are displayed) Labs Reviewed - No data to display  EKG EKG Interpretation Date/Time:  Wednesday Feb 22 2024 02:03:04 EDT Ventricular Rate:  94 PR Interval:  134 QRS Duration:  86 QT Interval:  360 QTC Calculation: 451 R Axis:   42  Text  Interpretation: Sinus rhythm Probable left atrial enlargement Probable anteroseptal infarct, old Confirmed by Donita Furrow ([id number]) on 02/22/2024 2:11:30 AM  Radiology DG Chest 2 View Result Date: 02/21/2024 CLINICAL DATA:  Shortness of breath. EXAM: CHEST - 2 VIEW COMPARISON:  Feb 19, 2024 FINDINGS: The heart size and mediastinal contours are within normal limits. There is no evidence of an acute infiltrate, pleural effusion or pneumothorax. The visualized skeletal structures are unremarkable. IMPRESSION: No active cardiopulmonary disease. Electronically Signed   By: Virgle Grime M.D.   On: 02/21/2024 19:16   VAS US  LOWER EXTREMITY VENOUS (DVT) (7a-7p) Result Date: 02/20/2024  Lower Venous DVT Study Patient Name:  RIKIA TRINKLE  Date of Exam:   02/20/2024 Medical Rec #: [id number]      Accession #:    [id number] Date of Birth: 1966-05-03      Patient Gender: F Patient Age:   66 years Exam Location:  [facility] Procedure:      VAS US  LOWER EXTREMITY VENOUS (DVT) Referring Phys: Keneth Pay --------------------------------------------------------------------------------  Indications: Pain.  Limitations: Poor ultrasound/tissue interface. Comparison Study: No prior studies. Performing Technologist: Lerry Ransom RVT  Examination Guidelines: A complete evaluation includes B-mode imaging, spectral Doppler, color Doppler, and power Doppler as needed of all accessible portions of each vessel. Bilateral testing is considered an integral part of a complete examination. Limited examinations for reoccurring indications may be performed as noted. The reflux portion of the exam is performed with the patient in reverse Trendelenburg.  +-----+---------------+---------+-----------+----------+--------------+ RIGHTCompressibilityPhasicitySpontaneityPropertiesThrombus Aging +-----+---------------+---------+-----------+----------+--------------+ CFV  Full           Yes      Yes                                  +-----+---------------+---------+-----------+----------+--------------+   +---------+---------------+---------+-----------+----------+--------------+ LEFT     CompressibilityPhasicitySpontaneityPropertiesThrombus Aging +---------+---------------+---------+-----------+----------+--------------+ CFV      Full           Yes      Yes                                 +---------+---------------+---------+-----------+----------+--------------+ SFJ      Full                                                        +---------+---------------+---------+-----------+----------+--------------+ FV Prox  Full                                                        +---------+---------------+---------+-----------+----------+--------------+  FV Mid   Full                                                        +---------+---------------+---------+-----------+----------+--------------+ FV DistalFull                                                        +---------+---------------+---------+-----------+----------+--------------+ PFV      Full                                                        +---------+---------------+---------+-----------+----------+--------------+ POP      Full           Yes      Yes                                 +---------+---------------+---------+-----------+----------+--------------+ PTV      Full                                                        +---------+---------------+---------+-----------+----------+--------------+ PERO     Full                                                        +---------+---------------+---------+-----------+----------+--------------+     Summary: RIGHT: - No evidence of common femoral vein obstruction.   LEFT: - There is no evidence of deep vein thrombosis in the lower extremity.  - No cystic structure found in the popliteal fossa.  *See table(s) above for measurements and observations.  Electronically signed by Irvin Mantel on 02/20/2024 at 5:23:25 PM.    Final    CT Angio Chest PE W and/or Wo Contrast Result Date: 02/20/2024 CLINICAL DATA:  Chest pain.  Clinical concern for pulmonary embolus. EXAM: CT ANGIOGRAPHY CHEST WITH CONTRAST TECHNIQUE: Multidetector CT imaging of the chest was performed using the standard protocol during bolus administration of intravenous contrast. Multiplanar CT image reconstructions and MIPs were obtained to evaluate the vascular anatomy. RADIATION DOSE REDUCTION: This exam was performed according to the departmental dose-optimization program which includes automated exposure control, adjustment of the mA and/or kV according to patient size and/or use of iterative reconstruction technique. CONTRAST:  75mL OMNIPAQUE IOHEXOL 350 MG/ML SOLN COMPARISON:  No comparison studies available. FINDINGS: Cardiovascular: The heart size is normal. No substantial pericardial effusion. No thoracic aortic aneurysm. No substantial atherosclerosis of the thoracic aorta. There is no filling defect within the opacified pulmonary arteries to suggest the presence of an acute pulmonary embolus. Mediastinum/Nodes: Upper normal mediastinal lymph nodes evident including 10 mm short axis right paratracheal node on 59/5. There is no hilar  lymphadenopathy. The esophagus has normal imaging features. There is no axillary lymphadenopathy. Lungs/Pleura: 6 mm right lung nodule identified on image 70/6. No focal airspace consolidation. No pleural effusion. Upper Abdomen: Visualized portion of the upper abdomen shows no acute findings. Musculoskeletal: No worrisome lytic or sclerotic osseous abnormality. Review of the MIP images confirms the above findings. IMPRESSION: 1. No CT evidence for acute pulmonary embolus. 2. 6 mm right lung nodule. Non-contrast chest CT at 6-12 months is recommended. If the nodule is stable at time of repeat CT, then future CT at 18-24 months (from today's scan) is considered  optional for low-risk patients, but is recommended for high-risk patients. This recommendation follows the consensus statement: Guidelines for Management of Incidental Pulmonary Nodules Detected on CT Images: From the Fleischner Society 2017; Radiology 2017; 284:228-243. Electronically Signed   By: Donnal Fusi M.D.   On: 02/20/2024 06:13    Procedures Procedures    Medications Ordered in ED Medications  ipratropium-albuterol  (DUONEB) 0.5-2.5 (3) MG/3ML nebulizer solution 3 mL (3 mLs Nebulization Given 02/22/24 0226)  dexamethasone  (DECADRON ) injection 10 mg (10 mg Intravenous Given 02/22/24 0234)  ipratropium-albuterol  (DUONEB) 0.5-2.5 (3) MG/3ML nebulizer solution 3 mL (3 mLs Nebulization Given 02/22/24 0301)    ED Course/ Medical Decision Making/ A&P Clinical Course as of 02/22/24 0356  Wed Feb 22, 2024  0258 Patient feels much better.  Wheezing significantly improved.  She always feels baseline.  Requesting 1 additional DuoNeb. [CH]    Clinical Course User Index [CH] Magdalene Tardiff, Vonzella Guernsey, MD                                 Medical Decision Making Amount and/or Complexity of Data Reviewed Radiology: ordered.  Risk Prescription drug management.   This patient presents to the ED for concern of shortness of breath, this involves an extensive number of treatment options, and is a complaint that carries with it a high risk of complications and morbidity.  I considered the following differential and admission for this acute, potentially life threatening condition.  The differential diagnosis includes allergic reaction, asthma exacerbation, pneumonia, pneumothorax  MDM:    This is a 58 year old female who presents for shortness of breath.  Had a CT scan approximately 36 hours prior to arrival and feels like she may have be having a reaction.  She is wheezing on exam but she is hemodynamically stable.  No hypotension.  No other signs or symptoms of anaphylaxis.  Could just be an asthma  exacerbation.  Patient was given a dose of Decadron  and a DuoNeb.  She had significant improvement of symptoms.  Do not feel she has any indication for epinephrine  at this time.  On multiple rechecks she has progressive improvement.  She was given a second DuoNeb and was able to ambulate maintain her pulse ox.  Chest x-ray without pneumothorax or pneumonia.  EKG reassuring.  (Labs, imaging, consults)  Labs: I Ordered, and personally interpreted labs.  The pertinent results include: None  Imaging Studies ordered: I ordered imaging studies including chest x-ray I independently visualized and interpreted imaging. I agree with the radiologist interpretation  Additional history obtained from chart review.  External records from outside source obtained and reviewed including prior evaluations  Cardiac Monitoring: The patient was maintained on a cardiac monitor.  If on the cardiac monitor, I personally viewed and interpreted the cardiac monitored which showed an underlying rhythm of: Sinus  Reevaluation: After  the interventions noted above, I reevaluated the patient and found that they have :improved  Social Determinants of Health:  lives independently  Disposition: Discharge  Co morbidities that complicate the patient evaluation  Past Medical History:  Diagnosis Date   Asthma    COVID-19 virus infection 10/2020   Morbid obesity (HCC)    Shrimp allergy      Medicines Meds ordered this encounter  Medications   ipratropium-albuterol  (DUONEB) 0.5-2.5 (3) MG/3ML nebulizer solution 3 mL   dexamethasone  (DECADRON ) injection 10 mg   ipratropium-albuterol  (DUONEB) 0.5-2.5 (3) MG/3ML nebulizer solution 3 mL    I have reviewed the patients home medicines and have made adjustments as needed  Problem List / ED Course: Problem List Items Addressed This Visit   None Visit Diagnoses       Mild intermittent asthma with exacerbation    -  Primary   Relevant Medications    ipratropium-albuterol  (DUONEB) 0.5-2.5 (3) MG/3ML nebulizer solution 3 mL (Completed)   dexamethasone  (DECADRON ) injection 10 mg (Completed)   ipratropium-albuterol  (DUONEB) 0.5-2.5 (3) MG/3ML nebulizer solution 3 mL (Completed)                   Final Clinical Impression(s) / ED Diagnoses Final diagnoses:  Mild intermittent asthma with exacerbation    Rx / DC Orders ED Discharge Orders     None         Carylon Claude, Vonzella Guernsey, MD 02/22/24 [phone]

## 2024-02-22 NOTE — ED Notes (Signed)
 Patient discharged in stable condition, education materials explained including, follow up, any prescriptions and reasons to return. Patient voiced agreement to education and discharge material.

## 2024-08-21 ENCOUNTER — Encounter (HOSPITAL_COMMUNITY): Payer: Self-pay

## 2024-08-21 ENCOUNTER — Ambulatory Visit (HOSPITAL_COMMUNITY)
Admission: EM | Admit: 2024-08-21 | Discharge: 2024-08-21 | Disposition: A | Discharge status: Home / Self Care | Payer: Self-pay | Attending: Emergency Medicine | Admitting: Emergency Medicine

## 2024-08-21 ENCOUNTER — Other Ambulatory Visit: Payer: Self-pay

## 2024-08-21 ENCOUNTER — Emergency Department (HOSPITAL_COMMUNITY)
Admission: EM | Admit: 2024-08-21 | Discharge: 2024-08-22 | Disposition: A | Discharge status: Home / Self Care | Payer: Self-pay | Attending: Emergency Medicine | Admitting: Emergency Medicine

## 2024-08-21 ENCOUNTER — Emergency Department (HOSPITAL_COMMUNITY): Payer: Self-pay

## 2024-08-21 ENCOUNTER — Encounter (HOSPITAL_COMMUNITY): Payer: Self-pay | Admitting: *Deleted

## 2024-08-21 DIAGNOSIS — J4541 Moderate persistent asthma with (acute) exacerbation: Secondary | ICD-10-CM | POA: Insufficient documentation

## 2024-08-21 LAB — BASIC METABOLIC PANEL WITH GFR
Anion gap: 14 (ref 5–15)
BUN: 9 mg/dL (ref 6–20)
CO2: 24 mmol/L (ref 22–32)
Calcium: 9.7 mg/dL (ref 8.9–10.3)
Chloride: 102 mmol/L (ref 98–111)
Creatinine, Ser: 0.8 mg/dL (ref 0.44–1.00)
GFR, Estimated: >60 mL/min (ref >60)
Glucose, Bld: 183 mg/dL — ABNORMAL HIGH (ref 70–99)
Potassium: 4.5 mmol/L (ref 3.5–5.1)
Sodium: 140 mmol/L (ref 135–145)

## 2024-08-21 LAB — CBC
HCT: 43.7 % (ref 36.0–46.0)
Hemoglobin: 13.6 g/dL (ref 12.0–15.0)
MCH: 28 pg (ref 26.0–34.0)
MCHC: 31.1 g/dL (ref 30.0–36.0)
MCV: 90.1 fL (ref 80.0–100.0)
Platelets: 284 K/uL (ref 150–400)
RBC: 4.85 MIL/uL (ref 3.87–5.11)
RDW: 14.4 % (ref 11.5–15.5)
WBC: 11.1 K/uL — ABNORMAL HIGH (ref 4.0–10.5)
nRBC: 0 % (ref 0.0–0.2)

## 2024-08-21 LAB — TROPONIN I (HIGH SENSITIVITY)
Troponin I (High Sensitivity): 5 ng/L (ref <18)
Troponin I (High Sensitivity): 5 ng/L (ref <18)

## 2024-08-21 MED ORDER — IPRATROPIUM-ALBUTEROL 0.5-2.5 (3) MG/3ML IN SOLN
RESPIRATORY_TRACT | Status: AC
  Filled 2024-08-21: qty 3

## 2024-08-21 MED ORDER — METHYLPREDNISOLONE SODIUM SUCC 125 MG IJ SOLR
INTRAMUSCULAR | Status: AC
  Filled 2024-08-21: qty 2

## 2024-08-21 MED ORDER — METHYLPREDNISOLONE SODIUM SUCC 125 MG IJ SOLR
125.0000 mg | Freq: Once | INTRAMUSCULAR | Status: AC
  Administered 2024-08-21: 125 mg via INTRAMUSCULAR

## 2024-08-21 MED ORDER — ALBUTEROL SULFATE HFA 108 (90 BASE) MCG/ACT IN AERS
INHALATION_SPRAY | RESPIRATORY_TRACT | Status: AC
  Filled 2024-08-21: qty 6.7

## 2024-08-21 MED ORDER — ALBUTEROL SULFATE (2.5 MG/3ML) 0.083% IN NEBU: 2.5000 mg | INHALATION_SOLUTION | Freq: Four times a day (QID) | RESPIRATORY_TRACT | 0 refills | Status: AC | PRN

## 2024-08-21 MED ORDER — ALBUTEROL SULFATE HFA 108 (90 BASE) MCG/ACT IN AERS
2.0000 | INHALATION_SPRAY | Freq: Once | RESPIRATORY_TRACT | Status: AC
Start: 2024-08-21 — End: 2024-08-21
  Administered 2024-08-21: 2 via RESPIRATORY_TRACT

## 2024-08-21 MED ORDER — PREDNISONE 20 MG PO TABS: 40.0000 mg | ORAL_TABLET | Freq: Every day | ORAL | 0 refills | Status: AC

## 2024-08-21 MED ORDER — IPRATROPIUM-ALBUTEROL 0.5-2.5 (3) MG/3ML IN SOLN
3.0000 mL | Freq: Once | RESPIRATORY_TRACT | Status: AC
  Administered 2024-08-21: 3 mL via RESPIRATORY_TRACT

## 2024-08-21 NOTE — ED Triage Notes (Signed)
 Patient states she has been short of breath all week. Patient stated  EMS gave her 3 breathing treatments and oxygen  around 4am this morning for [facility], Wheezing, and because her oxygen  was low.  She went to [facility] this morning and they gave her a steroid shot and 2 breathing treatments.  She reports that the treatments help temporarily and then the shob and wheezing come back.  Patient walked to the bathroom in triage and returned to room shob and wheezing. Reports she is having chest pain from breathing so hard.  Patient reports she has asthma.

## 2024-08-21 NOTE — ED Provider Triage Note (Signed)
 Emergency Medicine Provider Triage Evaluation Note  Camrynn Mcclintic , a 58 y.o. female  was evaluated in triage.  Pt complains of shortness of breath and wheezing. Hx of asthma but has not had asthma exacerbation in years. No daily maintenance medication, recently seen for similar at [facility] and improved with breathing treatments and steroids. Patient also appreciates chest pain that is episodic however she thinks this is from breathing.  Review of Systems  Positive: Shortness or breath, wheezing, chest pain Negative: Fever, chills, abdominal pain, nausea, vomiting  Physical Exam  BP (!) 193/121 (BP Location: Right Arm)   Pulse (!) 114   Temp 98 F (36.7 C)   Resp 18   Ht 5' 5 (1.651 m)   Wt 113.4 kg   LMP 11/19/2012   SpO2 92%   BMI 41.60 kg/m  Gen:   Awake, no distress   Resp:  Normal effort, patient talking in full sentences on room air, generalized wheezing with auscultation MSK:   Moves extremities without difficulty  Other:    Medical Decision Making  Medically screening exam initiated at 6:34 PM.  Appropriate orders placed.  Lauralie Blacksher was informed that the remainder of the evaluation will be completed by another provider, this initial triage assessment does not replace that evaluation, and the importance of remaining in the ED until their evaluation is complete.  Orders: CBC, BMP, troponin, CXR, EKG   Janetta Terrall [place], [place] 08/21/24 8161

## 2024-08-21 NOTE — ED Provider Notes (Signed)
 [facility]    CSN: [id number] Arrival date & time: 08/21/24  1136      History   Chief Complaint Chief Complaint  Patient presents with   Shortness of Breath   Wheezing   Cough    HPI Debbie Vaughn is a 57 y.o. female.   Patient presents with shortness of breath and wheezing that began about 2 days ago.  Patient states that EMS did come to her house last night and gave her 1 nebulizer treatment and was advised to follow-up with primary care doctor for refills of medications.    Patient does have a history of asthma.  Patient reports that she ran out of her albuterol  inhaler about a week ago.  Patient reports that she does have a nebulizer machine but it is currently packed up in storage.  Patient reports that she has not been on a daily inhaler for her asthma in years.  Denies fever, sore throat, body aches, chills, weakness, nausea, vomiting, diarrhea, and abdominal pain.  The history is provided by the patient and medical records.  Shortness of Breath Associated symptoms: cough and wheezing   Wheezing Associated symptoms: cough and shortness of breath   Cough Associated symptoms: shortness of breath and wheezing     Past Medical History:  Diagnosis Date   Asthma    COVID-19 virus infection 10/2020   Morbid obesity (HCC)    Shrimp allergy     Patient Active Problem List   Diagnosis Date Noted   Asthma    Morbid obesity (HCC)    COVID-19 virus infection 10/2020   Severe persistent asthma with exacerbation (HCC) 09/17/2019   Allergic rhinitis 09/17/2019    Past Surgical History:  Procedure Laterality Date   CESAREAN SECTION      OB History   No obstetric history on file.      Home Medications    Prior to Admission medications   Medication Sig Start Date End Date Taking? Authorizing Provider  albuterol  (PROVENTIL ) (2.5 MG/3ML) 0.083% nebulizer solution Take 3 mLs (2.5 mg total) by nebulization every 6 (six) hours as needed for wheezing or  shortness of breath. 08/21/24  Yes Johnie, Eathel Pajak A, NP  predniSONE  (DELTASONE ) 20 MG tablet Take 2 tablets (40 mg total) by mouth daily for 5 days. 08/21/24 08/26/24 Yes Callen Zuba A, NP  cephALEXin  (KEFLEX ) 500 MG capsule Take 1 capsule (500 mg total) by mouth 4 (four) times daily. 02/20/24   Sofia, Leslie K, PA-C  mometasone -formoterol  (DULERA ) 200-5 MCG/ACT AERO Inhale 2 puffs into the lungs 2 (two) times daily. Patient not taking: Reported on 02/20/2024 09/17/19   Brien Belvie BRAVO, MD  Nebulizer System All-In-One MISC 1 Dose by Does not apply route 4 (four) times daily as needed (shortness of breath). Use with albuterol  Patient not taking: Reported on 02/20/2024 10/17/19   Brien Belvie BRAVO, MD  cetirizine  (ZYRTEC ) 10 MG tablet Take 1 tablet (10 mg total) by mouth daily. Patient not taking: Reported on 08/01/2019 08/27/18 09/13/19  Tellis Laneta NOVAK, NP    Family History Family History  Problem Relation Age of Onset   Asthma Mother    Diabetes Father     Social History Social History   Tobacco Use   Smoking status: Never   Smokeless tobacco: Never  Vaping Use   Vaping status: Never Used  Substance Use Topics   Alcohol use: No   Drug use: No     Allergies   Corn-containing products, Iodine, Nsaids, Shellfish  allergy, and Oxycodone-acetaminophen    Review of Systems Review of Systems  Respiratory:  Positive for cough, shortness of breath and wheezing.    Per HPI  Physical Exam Triage Vital Signs ED Triage Vitals  Encounter Vitals Group     BP 08/21/24 1155 (!) 173/89     Girls Systolic BP Percentile --      Girls Diastolic BP Percentile --      Boys Systolic BP Percentile --      Boys Diastolic BP Percentile --      Pulse Rate 08/21/24 1155 97     Resp 08/21/24 1155 (!) 22     Temp 08/21/24 1155 98.1 F (36.7 C)     Temp Source 08/21/24 1155 Oral     SpO2 08/21/24 1155 95 %     Weight --      Height --      Head Circumference --      Peak Flow --       Pain Score 08/21/24 1154 0     Pain Loc --      Pain Education --      Exclude from Growth Chart --    No data found.  Updated Vital Signs BP (!) 173/89 (BP Location: Right Arm)   Pulse 97   Temp 98.1 F (36.7 C) (Oral)   Resp (!) 22   LMP 11/19/2012   SpO2 95%   Visual Acuity Right Eye Distance:   Left Eye Distance:   Bilateral Distance:    Right Eye Near:   Left Eye Near:    Bilateral Near:     Physical Exam Vitals and nursing note reviewed.  Constitutional:      General: She is awake. She is not in acute distress.    Appearance: Normal appearance. She is well-developed and well-groomed. She is not ill-appearing, toxic-appearing or diaphoretic.  Cardiovascular:     Rate and Rhythm: Normal rate and regular rhythm.  Pulmonary:     Effort: Tachypnea present.     Breath sounds: Decreased air movement present. Wheezing present.  Skin:    General: Skin is warm and dry.  Neurological:     General: No focal deficit present.     Mental Status: She is alert and oriented to person, place, and time. Mental status is at baseline.  Psychiatric:        Behavior: Behavior is cooperative.      UC Treatments / Results  Labs (all labs ordered are listed, but only abnormal results are displayed) Labs Reviewed - No data to display  EKG   Radiology No results found.  Procedures Procedures (including critical care time)  Medications Ordered in UC Medications  albuterol  (VENTOLIN  HFA) 108 (90 Base) MCG/ACT inhaler 2 puff (has no administration in time range)  ipratropium-albuterol  (DUONEB) 0.5-2.5 (3) MG/3ML nebulizer solution 3 mL (3 mLs Nebulization Given 08/21/24 1206)  methylPREDNISolone  sodium succinate (SOLU-MEDROL ) 125 mg/2 mL injection 125 mg (125 mg Intramuscular Given 08/21/24 1206)  ipratropium-albuterol  (DUONEB) 0.5-2.5 (3) MG/3ML nebulizer solution 3 mL (3 mLs Nebulization Given 08/21/24 1245)    Initial Impression / Assessment and Plan / UC Course  I have  reviewed the triage vital signs and the nursing notes.  Pertinent labs & imaging results that were available during my care of the patient were reviewed by me and considered in my medical decision making (see chart for details).     Patient is overall well-appearing, but mildly tachypneic.  Decreased air movement and wheezing  auscultated throughout all lung fields.  Given 2 DuoNebs and an injection of Solu-Medrol  in clinic with significant relief of wheezing and symptomatic relief.  SpO2 maintained at 95%.  Prescribed short course of prednisone  for asthma exacerbation.  Given albuterol  inhaler to use every 4-6 hours as needed for shortness of breath and wheezing.  Refilled albuterol  nebulizer treatments as well.  Discussed follow-up, return, and strict ER precautions. Final Clinical Impressions(s) / UC Diagnoses   Final diagnoses:  Moderate persistent asthma with acute exacerbation     Discharge Instructions      You were given 2 breathing treatments and a shot of steroids today to help with your asthma exacerbation. Tomorrow start taking 2 tablets of prednisone  once daily for 5 days for additional relief of this. Use albuterol  inhaler every 4-6 hours as needed for shortness of breath and wheezing. I have also sent a refill of your albuterol  nebulizer solutions that you can use every 6 hours as needed for shortness of breath and wheezing as well. Follow-up with your primary care provider for further evaluation and management of your asthma. Return here as needed or if you develop worsening shortness of breath, significant chest pain, severe weakness, or passing out please seek immediate medical treatment in the emergency department.     ED Prescriptions     Medication Sig Dispense Auth. Provider   predniSONE  (DELTASONE ) 20 MG tablet Take 2 tablets (40 mg total) by mouth daily for 5 days. 10 tablet Johnie Flaming A, NP   albuterol  (PROVENTIL ) (2.5 MG/3ML) 0.083% nebulizer solution  Take 3 mLs (2.5 mg total) by nebulization every 6 (six) hours as needed for wheezing or shortness of breath. 75 mL Johnie Flaming A, NP      PDMP not reviewed this encounter.   Johnie Flaming A, NP 08/21/24 [phone]

## 2024-08-21 NOTE — Discharge Instructions (Signed)
 You were given 2 breathing treatments and a shot of steroids today to help with your asthma exacerbation. Tomorrow start taking 2 tablets of prednisone  once daily for 5 days for additional relief of this. Use albuterol  inhaler every 4-6 hours as needed for shortness of breath and wheezing. I have also sent a refill of your albuterol  nebulizer solutions that you can use every 6 hours as needed for shortness of breath and wheezing as well. Follow-up with your primary care provider for further evaluation and management of your asthma. Return here as needed or if you develop worsening shortness of breath, significant chest pain, severe weakness, or passing out please seek immediate medical treatment in the emergency department.

## 2024-08-21 NOTE — ED Triage Notes (Signed)
 Pt states she has SOB and wheezing X 1 week. She states she is out of her asthma meds X 1 week as well.   Pt states EMS came to her house last night, gave her a neb but advised her to follow up for refills.

## 2024-08-22 MED ORDER — IPRATROPIUM-ALBUTEROL 0.5-2.5 (3) MG/3ML IN SOLN
3.0000 mL | Freq: Once | RESPIRATORY_TRACT | Status: AC
  Administered 2024-08-22: 3 mL via RESPIRATORY_TRACT
  Filled 2024-08-22: qty 3

## 2024-08-22 NOTE — Discharge Instructions (Addendum)
 As we discussed, you should likely follow-up with your primary care doctor if you have 1.  You can return to the emergency department for any worsening symptoms. Continue taking your prescriptions from urgent care as prescribed.

## 2024-08-22 NOTE — ED Provider Notes (Signed)
 Debbie Vaughn EMERGENCY DEPARTMENT AT [facility] Provider Note   CSN: [id number] Arrival date & time: 08/21/24  8279     Patient presents with: No chief complaint on file.   Debbie Vaughn is a 58 y.o. female patient with history of asthma who presents to the emergency department today for further evaluation of shortness of breath.  Patient was seen evaluated urgent care earlier yesterday for similar symptoms.  She received Solu-Medrol  IM and albuterol  treatment.  Patient states that she is fine at rest but when she moves she is still short of breath.  She is also been battling some chest congestion and nasal congestion as well.  She has not had an albuterol  inhaler for some time.  She denies any fever or chills.  No chest pain.  No abdominal pain, nausea, vomiting, diarrhea.   HPI     Prior to Admission medications   Medication Sig Start Date End Date Taking? Authorizing Provider  albuterol  (PROVENTIL ) (2.5 MG/3ML) 0.083% nebulizer solution Take 3 mLs (2.5 mg total) by nebulization every 6 (six) hours as needed for wheezing or shortness of breath. 08/21/24   Johnie Flaming A, NP  cephALEXin  (KEFLEX ) 500 MG capsule Take 1 capsule (500 mg total) by mouth 4 (four) times daily. 02/20/24   Sofia, Leslie K, PA-C  mometasone -formoterol  (DULERA ) 200-5 MCG/ACT AERO Inhale 2 puffs into the lungs 2 (two) times daily. Patient not taking: Reported on 02/20/2024 09/17/19   Brien Belvie BRAVO, MD  Nebulizer System All-In-One MISC 1 Dose by Does not apply route 4 (four) times daily as needed (shortness of breath). Use with albuterol  Patient not taking: Reported on 02/20/2024 10/17/19   Brien Belvie BRAVO, MD  predniSONE  (DELTASONE ) 20 MG tablet Take 2 tablets (40 mg total) by mouth daily for 5 days. 08/21/24 08/26/24  Johnie Flaming A, NP  cetirizine  (ZYRTEC ) 10 MG tablet Take 1 tablet (10 mg total) by mouth daily. Patient not taking: Reported on 08/01/2019 08/27/18 09/13/19  Burky, Natalie B, NP     Allergies: Corn-containing products, Iodine, Nsaids, Shellfish allergy, Oxycodone-acetaminophen , and Tylenol  [acetaminophen ]    Review of Systems  All other systems reviewed and are negative.   Updated Vital Signs BP (!) 173/85   Pulse 73   Temp 97.9 F (36.6 C)   Resp 13   Ht 5' 5 (1.651 m)   Wt 113.4 kg   LMP 11/19/2012   SpO2 100%   BMI 41.60 kg/m   Physical Exam Vitals and nursing note reviewed.  Constitutional:      General: She is not in acute distress.    Appearance: Normal appearance.  HENT:     Head: Normocephalic and atraumatic.  Eyes:     General:        Right eye: No discharge.        Left eye: No discharge.  Cardiovascular:     Comments: Regular rate and rhythm.  S1/S2 are distinct without any evidence of murmur, rubs, or gallops.  Radial pulses are 2+ bilaterally.  Dorsalis pedis pulses are 2+ bilaterally.  No evidence of pedal edema. Pulmonary:     Comments: Normal effort.  Good air movement heard throughout the entire precordium.  Mild expiratory wheeze heard diffusely. Abdominal:     General: Abdomen is flat. Bowel sounds are normal. There is no distension.     Tenderness: There is no abdominal tenderness. There is no guarding or rebound.  Musculoskeletal:        General: Normal range of  motion.     Cervical back: Neck supple.  Skin:    General: Skin is warm and dry.     Findings: No rash.  Neurological:     General: No focal deficit present.     Mental Status: She is alert.  Psychiatric:        Mood and Affect: Mood normal.        Behavior: Behavior normal.     (all labs ordered are listed, but only abnormal results are displayed) Labs Reviewed  BASIC METABOLIC PANEL WITH GFR - Abnormal; Notable for the following components:      Result Value   Glucose, Bld 183 (*)    All other components within normal limits  CBC - Abnormal; Notable for the following components:   WBC 11.1 (*)    All other components within normal limits  TROPONIN  I (HIGH SENSITIVITY)  TROPONIN I (HIGH SENSITIVITY)    EKG: EKG Interpretation Date/Time:  Tuesday August 21 2024 18:17:50 EST Ventricular Rate:  114 PR Interval:  144 QRS Duration:  84 QT Interval:  338 QTC Calculation: 465 R Axis:   51  Text Interpretation: Sinus tachycardia Biatrial enlargement Abnormal ECG When compared with ECG of 22-Feb-2024 02:03, HEART RATE has increased Confirmed by Raford Lenis ([id number]) on 08/22/2024 2:39:05 AM  Radiology: DG Chest 2 View Result Date: 08/21/2024 EXAM: 2 VIEW(S) XRAY OF THE CHEST 08/21/2024 07:07:22 PM COMPARISON: 02/21/2024 CLINICAL HISTORY: shob FINDINGS: LUNGS AND PLEURA: No focal pulmonary opacity. No pulmonary edema. No pleural effusion. No pneumothorax. HEART AND MEDIASTINUM: No acute abnormality of the cardiac and mediastinal silhouettes. BONES AND SOFT TISSUES: Thoracic degenerative changes. IMPRESSION: 1. No acute cardiopulmonary process. Electronically signed by: Pinkie Pebbles MD 08/21/2024 07:11 PM EST RP Workstation: [id number]     Procedures   Medications Ordered in the ED  ipratropium-albuterol  (DUONEB) 0.5-2.5 (3) MG/3ML nebulizer solution 3 mL (3 mLs Nebulization Given 08/22/24 0315)     Medical Decision Making Debbie Vaughn is a 58 y.o. female patient who presents to the emergency department today for further evaluation of asthma exacerbation.  Sounds like patient is poorly controlled given her more frequent asthma exacerbations.  Patient was sleeping and snoring upon me entering the room.  She was easily arousable.  No evidence of respiratory distress that I can see.  No auditory stridor or wheezing that I could hear without auscultation.  Patient may have a viral URI which could be exacerbating her asthma symptoms.  Will give her 1 DuoNeb treatment and plan to discharge home.  Patient's labs are all reassuring.  I do not see any evidence of Electra abnormalities.  Troponins were negative x 2.  Chest x-ray was clear.   Patient does have some leukocytosis but she just her steroids today.  This could be still related versus possible upper respiratory infection as well.  Patient feeling better after DuoNeb.  Strict turn precautions were discussed.  This is likely just asthma exacerbation likely compounded from possible URI.  She is safe for discharge at this time.  Amount and/or Complexity of Data Reviewed Labs: ordered. Radiology: ordered.  Risk Prescription drug management.     Final diagnoses:  Moderate persistent asthma with exacerbation    ED Discharge Orders     None          Theotis Cameron HERO, [place] 08/22/24 [id number]    Raford Lenis, MD 08/22/24 [phone]
